# Patient Record
Sex: Female | Born: 1990
Health system: Southern US, Community
[De-identification: ages and names within clinical notes are randomized; demographics above are authoritative.]

## PROBLEM LIST (undated history)

## (undated) DIAGNOSIS — K219 Gastro-esophageal reflux disease without esophagitis: Secondary | ICD-10-CM

## (undated) DIAGNOSIS — F32A Depression, unspecified: Secondary | ICD-10-CM

## (undated) DIAGNOSIS — F329 Major depressive disorder, single episode, unspecified: Secondary | ICD-10-CM

## (undated) DIAGNOSIS — F419 Anxiety disorder, unspecified: Secondary | ICD-10-CM

## (undated) DIAGNOSIS — J45909 Unspecified asthma, uncomplicated: Secondary | ICD-10-CM

## (undated) HISTORY — DX: Unspecified asthma, uncomplicated: J45.909

## (undated) HISTORY — DX: Depression, unspecified: F32.A

## (undated) HISTORY — PX: WISDOM TOOTH EXTRACTION: SHX21

## (undated) HISTORY — PX: ESOPHAGOGASTRODUODENOSCOPY: SHX1529

## (undated) HISTORY — DX: Anxiety disorder, unspecified: F41.9

## (undated) HISTORY — DX: Gastro-esophageal reflux disease without esophagitis: K21.9

---
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
## 2013-04-14 HISTORY — PX: BREAST FIBROADENOMA SURGERY: SHX580

## 2017-04-30 DIAGNOSIS — F422 Mixed obsessional thoughts and acts: Secondary | ICD-10-CM | POA: Diagnosis not present

## 2017-05-04 DIAGNOSIS — Z01419 Encounter for gynecological examination (general) (routine) without abnormal findings: Secondary | ICD-10-CM | POA: Diagnosis not present

## 2017-05-04 DIAGNOSIS — R87612 Low grade squamous intraepithelial lesion on cytologic smear of cervix (LGSIL): Secondary | ICD-10-CM | POA: Diagnosis not present

## 2017-06-25 DIAGNOSIS — F422 Mixed obsessional thoughts and acts: Secondary | ICD-10-CM | POA: Diagnosis not present

## 2017-08-11 ENCOUNTER — Ambulatory Visit (INDEPENDENT_AMBULATORY_CARE_PROVIDER_SITE_OTHER): Payer: BLUE CROSS/BLUE SHIELD | Admitting: Allergy and Immunology

## 2017-08-11 ENCOUNTER — Encounter: Payer: Self-pay | Admitting: Allergy and Immunology

## 2017-08-11 VITALS — BP 132/78 | HR 84 | Temp 98.7°F | Resp 16 | Ht 64.0 in | Wt 116.0 lb

## 2017-08-11 DIAGNOSIS — J3089 Other allergic rhinitis: Secondary | ICD-10-CM | POA: Diagnosis not present

## 2017-08-11 DIAGNOSIS — Z91018 Allergy to other foods: Secondary | ICD-10-CM | POA: Diagnosis not present

## 2017-08-11 NOTE — Progress Notes (Signed)
NEW PATIENT NOTE  Referring Provider: No ref. provider found Primary Provider: Patient, No Pcp Per Date of office visit: 08/11/2017    Subjective:   Chief Complaint:  Abigail Rodgers (DOB: [birth year]) is a 27 y.o. female who presents to the clinic on 08/11/2017 with a chief complaint of Allergy Testing (food sensitivities ) .  HPI: Abigail Rodgers presents to this clinic in evaluation of possible allergies.  She has a history of sneezing and nasal congestion and itchy watery eyes and itchy throat if she gets around cats or dogs or pollen.  For the most part she can get good control of this issue by just performing avoidance measures and she rarely uses any antihistamines or other therapy for her atopic disease.  She also has a significant issue with her abdomen with cramping and bloating.  This apparently occurred on most days until she started sertraline and now this appears to occur about 1 time per month.  She also eliminated consumption of almonds and started Lactaid whenever she use dairy around the same point in time.  She apparently had some type of sensitivity test that showed hypersensitivity against shellfish and dairy and peanut.  She does not eat shellfish but she can eat peanut without any problem.  History reviewed. No pertinent past medical history.  Past Surgical History:  Procedure Laterality Date  . BREAST FIBROADENOMA SURGERY  2015    Allergies as of 08/11/2017      Reactions   Penicillins Rash      Medication List      dicyclomine 10 MG capsule Commonly known as:  BENTYL TK ONE C PO TID PRF ABD CRAMPING   LORazepam 0.5 MG tablet Commonly known as:  ATIVAN TK 1/2 TO 1 T PO BID PRA   sertraline 50 MG tablet Commonly known as:  ZOLOFT TK 1 T PO QAM       Review of systems negative except as noted in HPI / PMHx or noted below:  Review of Systems  Constitutional: Negative.   HENT: Negative.   Eyes: Negative.   Respiratory: Negative.   Cardiovascular:  Negative.   Gastrointestinal: Negative.   Genitourinary: Negative.   Musculoskeletal: Negative.   Skin: Negative.   Neurological: Negative.   Endo/Heme/Allergies: Negative.   Psychiatric/Behavioral: Negative.     Family History  Problem Relation Age of Onset  . Allergic rhinitis Neg Hx   . Asthma Neg Hx   . Eczema Neg Hx   . Urticaria Neg Hx     Social History   Socioeconomic History  . Marital status: Single    Spouse name: Not on file  . Number of children: Not on file  . Years of education: Not on file  . Highest education level: Not on file  Occupational History  . Not on file  Social Needs  . Financial resource strain: Not on file  . Food insecurity:    Worry: Not on file    Inability: Not on file  . Transportation needs:    Medical: Not on file    Non-medical: Not on file  Tobacco Use  . Smoking status: Never Smoker  . Smokeless tobacco: Never Used  Substance and Sexual Activity  . Alcohol use: Yes    Comment: occasional  . Drug use: Never  . Sexual activity: Not on file  Lifestyle  . Physical activity:    Days per week: Not on file    Minutes per session: Not on file  .  Stress: Not on file  Relationships  . Social connections:    Talks on phone: Not on file    Gets together: Not on file    Attends religious service: Not on file    Active member of club or organization: Not on file    Attends meetings of clubs or organizations: Not on file    Relationship status: Not on file  . Intimate partner violence:    Fear of current or ex partner: Not on file    Emotionally abused: Not on file    Physically abused: Not on file    Forced sexual activity: Not on file  Other Topics Concern  . Not on file  Social History Narrative  . Not on file    Environmental and Social history  Lives in a apartment with a dry environment, no animals located inside the household, would on the bedroom floor, no plastic on the bed, plastic on the pillow, and no smokers  located inside the household.  She is a Counsellor.  Objective:   Vitals:   08/11/17 1529  BP: 132/78  Pulse: 84  Resp: 16  Temp: 98.7 F (37.1 C)   Height:  (162.6 cm) Weight: 116 lb (52.6 kg)  Physical Exam  HENT:  Head: Normocephalic. Head is without right periorbital erythema and without left periorbital erythema.  Right Ear: Tympanic membrane, external ear and ear canal normal.  Left Ear: Tympanic membrane, external ear and ear canal normal.  Nose: Nose normal. No mucosal edema or rhinorrhea.  Mouth/Throat: Uvula is midline, oropharynx is clear and moist and mucous membranes are normal. No oropharyngeal exudate.  Eyes: Pupils are equal, round, and reactive to light. Conjunctivae and lids are normal.  Neck: Trachea normal. No tracheal tenderness present. No tracheal deviation present. No thyromegaly present.  Cardiovascular: Normal rate, regular rhythm, S1 normal, S2 normal and normal heart sounds.  No murmur heard. Pulmonary/Chest: Effort normal and breath sounds normal. No stridor. No respiratory distress. She has no wheezes. She has no rales. She exhibits no tenderness.  Abdominal: Soft. She exhibits no distension and no mass. There is no hepatosplenomegaly. There is no tenderness. There is no rebound and no guarding.  Musculoskeletal: She exhibits no edema or tenderness.  Lymphadenopathy:       Head (right side): No tonsillar adenopathy present.       Head (left side): No tonsillar adenopathy present.    She has no cervical adenopathy.    She has no axillary adenopathy.  Neurological: She is alert.  Skin: No rash noted. She is not diaphoretic. No erythema. No pallor. Nails show no clubbing.    Diagnostics: Allergy skin tests were performed.  She did not demonstrate any hypersensitivity against a screening panel of foods including shellfish.  She did demonstrate significant hypersensitivity against grasses, weeds, trees, molds, cat, and dog.  Assessment and  Plan:    1. Other allergic rhinitis   2. Food allergy     1.  Allergen avoidance measures  2.  Can use OTC loratadine 10 mg 1 time per day if needed  3.  Further evaluation and treatment?  4.  Review results of previous sensitivity testing  There is no evidence that medicine has a significant food allergy.  I will review her previous sensitivity blood testing to see exactly what test was ordered.  I suspect that this was an IgG food panel which is not associated with food allergy.  She does appear to have sensitivity  directed against various aeroallergens and we have given her literature on how to avoid these exposures and she can use an antihistamine as needed.  I will see her back in this clinic if needed.  Laurette Schimke, MD Allergy / Immunology [facility]

## 2017-08-11 NOTE — Patient Instructions (Addendum)
  1.  Allergen avoidance measures  2.  Can use OTC loratadine 10 mg 1 time per day if needed  3.  Further evaluation and treatment?  4.  Review results of previous sensitivity testing

## 2017-08-12 ENCOUNTER — Encounter: Payer: Self-pay | Admitting: Allergy and Immunology

## 2017-10-09 DIAGNOSIS — F422 Mixed obsessional thoughts and acts: Secondary | ICD-10-CM | POA: Diagnosis not present

## 2017-12-26 DIAGNOSIS — J029 Acute pharyngitis, unspecified: Secondary | ICD-10-CM | POA: Diagnosis not present

## 2017-12-26 DIAGNOSIS — R030 Elevated blood-pressure reading, without diagnosis of hypertension: Secondary | ICD-10-CM | POA: Diagnosis not present

## 2017-12-28 DIAGNOSIS — J029 Acute pharyngitis, unspecified: Secondary | ICD-10-CM | POA: Diagnosis not present

## 2018-03-14 ENCOUNTER — Encounter: Payer: Self-pay | Admitting: Emergency Medicine

## 2018-03-14 DIAGNOSIS — F429 Obsessive-compulsive disorder, unspecified: Secondary | ICD-10-CM | POA: Insufficient documentation

## 2018-03-26 ENCOUNTER — Ambulatory Visit (INDEPENDENT_AMBULATORY_CARE_PROVIDER_SITE_OTHER): Payer: BLUE CROSS/BLUE SHIELD | Admitting: Physician Assistant

## 2018-03-26 ENCOUNTER — Encounter: Payer: Self-pay | Admitting: Physician Assistant

## 2018-03-26 DIAGNOSIS — F422 Mixed obsessional thoughts and acts: Secondary | ICD-10-CM | POA: Diagnosis not present

## 2018-03-26 MED ORDER — SERTRALINE HCL 50 MG PO TABS: 50.0000 mg | ORAL_TABLET | Freq: Every day | ORAL | 1 refills | Status: DC

## 2018-03-26 NOTE — Progress Notes (Signed)
[facility]  Patient ID: Abigail Rodgers,  MRN: [id number]  PCP: Waldemar DickensWard, Demming M, MD  Date of Evaluation: 03/26/2018 Time spent:10 minutes  Chief Complaint:  Chief Complaint    Follow-up      HISTORY/CURRENT STATUS: HPI here for 6993-month med check.  Patient states she is doing great.  "This is possibly the best year of ever had!  ".  The obsessive thoughts and behaviors are much better.  Dates that every once in a while she will have thoughts that she cannot get out of her mind but not often.  She is only had to take the Ativan maybe twice since her last visit.  Patient denies loss of interest in usual activities and is able to enjoy things.  Denies decreased energy or motivation.  Appetite has not changed.  No extreme sadness, tearfulness, or feelings of hopelessness.  Denies any changes in concentration, making decisions or remembering things.  Denies suicidal or homicidal thoughts.  She sleeps well.  Work is going [employer].  She is very busy but likes what she is doing at [employer].  She bought a house recently and has been working fixing that up.  Individual Medical History/ Review of Systems: Changes? :No    Past medications for mental health diagnoses include: None  Allergies: Penicillins  Current Medications:  Current Outpatient Medications:  .  LORazepam (ATIVAN) 0.5 MG tablet, TK 1/2 TO 1 T PO BID PRA, Disp: , Rfl:  .  sertraline (ZOLOFT) 50 MG tablet, Take 1 tablet (50 mg total) by mouth daily., Disp: 90 tablet, Rfl: 1 Medication Side Effects: none  Family Medical/ Social History: Changes? No  MENTAL HEALTH EXAM:  There were no vitals taken for this visit.There is no height or weight on file to calculate BMI.  General Appearance: Casual and Well Groomed  Eye Contact:  Good  Speech:  Clear and Coherent  Volume:  Normal  Mood:  Euthymic  Affect:  Appropriate  Thought Process:  Goal Directed  Orientation:  Full (Time, Place, and Person)  Thought Content:  Logical   Suicidal Thoughts:  No  Homicidal Thoughts:  No  Memory:  WNL  Judgement:  Good  Insight:  Good  Psychomotor Activity:  Normal  Concentration:  Concentration: Good  Recall:  Good  Fund of Knowledge: Good  Language: Good  Assets:  Desire for Improvement  ADL's:  Intact  Cognition: WNL  Prognosis:  Good    DIAGNOSES:    ICD-10-CM   1. Mixed obsessional thoughts and acts F42.2     Receiving Psychotherapy: No    RECOMMENDATIONS: Continue Zoloft and Ativan as above. Return in 6 months or sooner as needed.   Melony Overlyeresa Hurst, PA-C

## 2018-09-23 ENCOUNTER — Other Ambulatory Visit: Payer: Self-pay | Admitting: Physician Assistant

## 2018-09-23 NOTE — Telephone Encounter (Signed)
Has appt tomorrow 06/12

## 2018-09-24 ENCOUNTER — Encounter: Payer: Self-pay | Admitting: Physician Assistant

## 2018-09-24 ENCOUNTER — Other Ambulatory Visit: Payer: Self-pay

## 2018-09-24 ENCOUNTER — Ambulatory Visit (INDEPENDENT_AMBULATORY_CARE_PROVIDER_SITE_OTHER): Payer: BC Managed Care – PPO | Admitting: Physician Assistant

## 2018-09-24 DIAGNOSIS — F422 Mixed obsessional thoughts and acts: Secondary | ICD-10-CM | POA: Diagnosis not present

## 2018-09-24 MED ORDER — SERTRALINE HCL 25 MG PO TABS: ORAL_TABLET | ORAL | 0 refills | Status: DC

## 2018-09-24 NOTE — Progress Notes (Signed)
[facility]  Patient ID: Abigail Rodgers,  MRN: [id number]  PCP: Toya Smothers, MD  Date of Evaluation: 09/24/2018 Time spent:15 minutes  Chief Complaint:  Chief Complaint    Follow-up      HISTORY/CURRENT STATUS: HPI for 98-month med check.  Patient states she is doing very well and would like to go off of the Zoloft if possible.  She is not having any more anxiety to speak of.  She has needed 2 of the Ativan in the past 5 months.  She is not having obsessive thoughts or compulsive behaviors.  Work is going very well and she is working from home now due to the coronavirus pandemic.  She sleeps well.  Patient denies loss of interest in usual activities and is able to enjoy things.  Denies decreased energy or motivation.  Appetite has not changed.  No extreme sadness, tearfulness, or feelings of hopelessness.  Denies any changes in concentration, making decisions or remembering things.  Denies suicidal or homicidal thoughts.  Denies dizziness, syncope, seizures, numbness, tingling, tremor, tics, unsteady gait, slurred speech, confusion. Denies muscle or joint pain, stiffness, or dystonia.  Individual Medical History/ Review of Systems: Changes? :No    Past medications for mental health diagnoses include: Zoloft, Ativan  Allergies: Penicillins  Current Medications:  Current Outpatient Medications:  .  LORazepam (ATIVAN) 0.5 MG tablet, TK 1/2 TO 1 T PO BID PRA, Disp: , Rfl:  .  sertraline (ZOLOFT) 25 MG tablet, 1.5 po qd for 7 days, then 1 qd for 7 days, then 1/2 pill till gone., Disp: 21 tablet, Rfl: 0 Medication Side Effects: none  Family Medical/ Social History: Changes? No except working from home now  [place]:  There were no vitals taken for this visit.There is no height or weight on file to calculate BMI.  General Appearance: Casual  Eye Contact:  Good  Speech:  Clear and Coherent  Volume:  Normal  Mood:  Euthymic  Affect:  Appropriate  Thought  Process:  Goal Directed  Orientation:  Full (Time, Place, and Person)  Thought Content: Logical   Suicidal Thoughts:  No  Homicidal Thoughts:  No  Memory:  WNL  Judgement:  Good  Insight:  Good  Psychomotor Activity:  Normal  Concentration:  Concentration: Good  Recall:  Good  Fund of Knowledge: Good  Language: Good  Assets:  Desire for Improvement  ADL's:  Intact  Cognition: WNL  Prognosis:  Good    DIAGNOSES:    ICD-10-CM   1. Mixed obsessional thoughts and acts  F42.2     Receiving Psychotherapy: No    RECOMMENDATIONS:  It is okay to wean off the Zoloft.  If the obsessive thoughts recur or anxiety worsens then we will need to restart it.  We can do that by phone if needed. Wean off Zoloft as follows 25 mg, 1.5 pills daily for 1 week, then 1 p.o. daily for 1 week, then 1/2 pill p.o. daily for 1 week then stop. Continue Ativan 0.5 mg 1/2-1 twice daily as needed. Return in 6 months.   Donnal Moat, PA-C   This record has been created using [employer].  Chart creation errors have been sought, but may not always have been located and corrected. Such creation errors do not reflect on the standard of medical care.

## 2018-09-30 DIAGNOSIS — Z01419 Encounter for gynecological examination (general) (routine) without abnormal findings: Secondary | ICD-10-CM | POA: Diagnosis not present

## 2018-09-30 DIAGNOSIS — Z118 Encounter for screening for other infectious and parasitic diseases: Secondary | ICD-10-CM | POA: Diagnosis not present

## 2018-09-30 DIAGNOSIS — Z6820 Body mass index (BMI) 20.0-20.9, adult: Secondary | ICD-10-CM | POA: Diagnosis not present

## 2018-09-30 DIAGNOSIS — Z113 Encounter for screening for infections with a predominantly sexual mode of transmission: Secondary | ICD-10-CM | POA: Diagnosis not present

## 2018-09-30 DIAGNOSIS — B373 Candidiasis of vulva and vagina: Secondary | ICD-10-CM | POA: Diagnosis not present

## 2018-09-30 DIAGNOSIS — Z1159 Encounter for screening for other viral diseases: Secondary | ICD-10-CM | POA: Diagnosis not present

## 2018-09-30 DIAGNOSIS — Z114 Encounter for screening for human immunodeficiency virus [HIV]: Secondary | ICD-10-CM | POA: Diagnosis not present

## 2018-10-04 ENCOUNTER — Other Ambulatory Visit: Payer: Self-pay | Admitting: Obstetrics and Gynecology

## 2018-10-04 DIAGNOSIS — N6310 Unspecified lump in the right breast, unspecified quadrant: Secondary | ICD-10-CM

## 2018-10-12 ENCOUNTER — Other Ambulatory Visit: Payer: Self-pay | Admitting: Obstetrics and Gynecology

## 2018-10-12 ENCOUNTER — Ambulatory Visit
Admission: RE | Admit: 2018-10-12 | Discharge: 2018-10-12 | Disposition: A | Discharge status: Home / Self Care | Payer: BC Managed Care – PPO | Source: Ambulatory Visit | Attending: Obstetrics and Gynecology | Admitting: Obstetrics and Gynecology

## 2018-10-12 ENCOUNTER — Other Ambulatory Visit: Payer: Self-pay

## 2018-10-12 DIAGNOSIS — N6310 Unspecified lump in the right breast, unspecified quadrant: Secondary | ICD-10-CM

## 2018-10-12 DIAGNOSIS — N6313 Unspecified lump in the right breast, lower outer quadrant: Secondary | ICD-10-CM | POA: Diagnosis not present

## 2018-10-12 DIAGNOSIS — N6314 Unspecified lump in the right breast, lower inner quadrant: Secondary | ICD-10-CM | POA: Diagnosis not present

## 2019-02-04 ENCOUNTER — Encounter: Payer: Self-pay | Admitting: Gastroenterology

## 2019-02-16 ENCOUNTER — Ambulatory Visit: Payer: BC Managed Care – PPO | Admitting: Gastroenterology

## 2019-02-16 ENCOUNTER — Other Ambulatory Visit: Payer: Self-pay

## 2019-02-16 ENCOUNTER — Encounter: Payer: Self-pay | Admitting: Gastroenterology

## 2019-02-16 VITALS — BP 130/90 | HR 84 | Temp 98.9°F | Ht 65.0 in | Wt 122.1 lb

## 2019-02-16 DIAGNOSIS — K625 Hemorrhage of anus and rectum: Secondary | ICD-10-CM | POA: Diagnosis not present

## 2019-02-16 DIAGNOSIS — K648 Other hemorrhoids: Secondary | ICD-10-CM

## 2019-02-16 MED ORDER — HYDROCORTISONE ACETATE 25 MG RE SUPP: 25.0000 mg | Freq: Every day | RECTAL | 2 refills | Status: DC

## 2019-02-16 NOTE — Patient Instructions (Signed)
If you are age 30 or older, your body mass index should be between 23-30. Your Body mass index is 20.32 kg/m. If this is out of the aforementioned range listed, please consider follow up with your Primary Care Provider.  If you are age 40 or younger, your body mass index should be between 19-25. Your Body mass index is 20.32 kg/m. If this is out of the aformentioned range listed, please consider follow up with your Primary Care Provider.   We have sent the following medications to your pharmacy for you to pick up at your convenience: Hydrocortisone suppositories  Follow up as needed.  Thank you for choosing me and [facility].   Alonza Bogus, PA-C

## 2019-02-16 NOTE — Progress Notes (Signed)
02/16/2019 Abigail Rodgers [id number] [birth year]   HISTORY OF PRESENT ILLNESS:  This is a pleasant 28 year old female who is new to our office.  She is here today with complaints of rectal bleeding.  She says that back in 2016 after doing a lot of sit-ups she developed a painful hemorrhoid.  She used wipes, etc and it got better, but since that time she has periodically seen small amounts of bright red blood on the toilet paper upon wiping.  She tells me that recently she's had two episodes of more bleeding, still bright red in color, but drips into the toilet bowl even between bowel movements when she sits to urinate, etc.  This bleeding lasts usually 3 days before resolving and then she has the very light bleeding on the toilet paper between episodes.  No rectal pain, no abdominal pain.  Moves her bowels well without any issues, no constipation or diarrhea.  No family history of colon cancer.  Mom and dad had polyps.   Past Medical History:  Diagnosis Date   Anxiety    Asthma    Depression    GERD (gastroesophageal reflux disease)    Past Surgical History:  Procedure Laterality Date   BREAST FIBROADENOMA SURGERY  2015   WISDOM TOOTH EXTRACTION      reports that she has never smoked. She has never used smokeless tobacco. She reports current alcohol use of about 1.0 standard drinks of alcohol per week. She reports that she does not use drugs. family history includes Colon polyps in her father and mother; Dementia in her maternal grandmother; Hyperlipidemia in her brother, maternal aunt, maternal grandmother, and maternal uncle; Hypertension in her mother; Irritable bowel syndrome in her maternal grandmother; Prostate cancer in her paternal grandfather. Allergies  Allergen Reactions   Penicillins Rash      Outpatient Encounter Medications as of 02/16/2019  Medication Sig   hydrocortisone (ANUSOL-HC) 25 MG suppository Place 1 suppository (25 mg total) rectally at bedtime. Use  for 10 days.   [DISCONTINUED] LORazepam (ATIVAN) 0.5 MG tablet TK 1/2 TO 1 T PO BID PRA   [DISCONTINUED] sertraline (ZOLOFT) 25 MG tablet 1.5 po qd for 7 days, then 1 qd for 7 days, then 1/2 pill till gone.   No facility-administered encounter medications on file as of 02/16/2019.      REVIEW OF SYSTEMS  : All other systems reviewed and negative except where noted in the History of Present Illness.   PHYSICAL EXAM: BP 130/90 (BP Location: Left Arm, Patient Position: Sitting, Cuff Size: Normal)    Pulse 84    Temp 98.9 F (37.2 C)    Ht 5\' 5"  (1.651 m) Comment: height measured without shoes   Wt 122 lb 2 oz (55.4 kg)    LMP 01/26/2019    BMI 20.32 kg/m  General: Well developed white female in no acute distress Head: Normocephalic and atraumatic Eyes:  Sclerae anicteric, conjunctiva pink. Ears: Normal auditory acuity Lungs: Clear throughout to auscultation; no increased WOB. Heart: Regular rate and rhythm; no M/R/G. Abdomen: Soft, non-distended.  BS present.  Non-tender. Rectal:  Small external hemorrhoid tissue noted.  DRE did not reveal any masses.  Small amount of light brown stool on exam glove.  Anoscopy revealed bleeding internal hemorrhoid posteriorly, scope induced bleeding. Musculoskeletal: Symmetrical with no gross deformities  Skin: No lesions on visible extremities Extremities: No edema  Neurological: Alert oriented x 4, grossly non-focal Psychological:  Alert and cooperative. Normal mood and  affect  ASSESSMENT AND PLAN: *Bleeding internal hemorrhoid:  Bleeding seen on anoscopy exam.  Will treat with hydrocortisone suppositories at bedtime for 7-10 days and repeat if needed.  Keep stools soft and avoid aggressive cleaning.  Discussed role of in-office hemorrhoid banding if bleeding continues to be an issue and she ultimately wants to proceed with that.  Literature given.     CC:  Ward, Ellison Hughs, MD

## 2019-02-17 NOTE — Progress Notes (Signed)
____________________________________________________________  Attending physician addendum:  Thank you for sending this case to me. I have reviewed the entire note, and the outlined plan seems appropriate.  Henry Danis, MD  ____________________________________________________________  

## 2019-03-08 DIAGNOSIS — A630 Anogenital (venereal) warts: Secondary | ICD-10-CM | POA: Diagnosis not present

## 2019-03-25 ENCOUNTER — Ambulatory Visit (INDEPENDENT_AMBULATORY_CARE_PROVIDER_SITE_OTHER): Payer: BC Managed Care – PPO | Admitting: Physician Assistant

## 2019-03-25 ENCOUNTER — Encounter: Payer: Self-pay | Admitting: Physician Assistant

## 2019-03-25 ENCOUNTER — Other Ambulatory Visit: Payer: Self-pay

## 2019-03-25 DIAGNOSIS — F422 Mixed obsessional thoughts and acts: Secondary | ICD-10-CM

## 2019-03-25 NOTE — Progress Notes (Signed)
[facility]  Patient ID: Abigail Rodgers,  MRN: [id number]  PCP: Toya Smothers, MD  Date of Evaluation: 03/25/2019 Time spent:10 minutes  Chief Complaint:  Chief Complaint    Follow-up      HISTORY/CURRENT STATUS: HPI For 6 month med check.  She weaned off the Zoloft during the summer, with my direction.  States she has been doing great since then.  She will occasionally have a time where she is more anxious and has obsessive thoughts but it is not common.  Since being off of the Zoloft, she is also felt a little bit more moody but says she has been that way all of her life and it is not something that she wants to take medicine for right now.  Patient denies loss of interest in usual activities and is able to enjoy things.  Denies decreased energy or motivation.  Appetite has not changed.  No extreme sadness, tearfulness, or feelings of hopelessness.  Denies any changes in concentration, making decisions or remembering things.  Denies suicidal or homicidal thoughts.  Denies dizziness, syncope, seizures, numbness, tingling, tremor, tics, unsteady gait, slurred speech, confusion. Denies muscle or joint pain, stiffness, or dystonia.  Individual Medical History/ Review of Systems: Changes? :No   Past medications for mental health diagnoses include: Zoloft, Ativan  Allergies: Penicillins  Current Medications:  Current Outpatient Medications:  .  hydrocortisone (ANUSOL-HC) 25 MG suppository, Place 1 suppository (25 mg total) rectally at bedtime. Use for 10 days. (Patient not taking: Reported on 03/25/2019), Disp: 12 suppository, Rfl: 2 Medication Side Effects: none  Family Medical/ Social History: Changes? No  MENTAL HEALTH EXAM:  There were no vitals taken for this visit.There is no height or weight on file to calculate BMI.  General Appearance: Casual, Neat and Well Groomed  Eye Contact:  Good  Speech:  Clear and Coherent  Volume:  Normal  Mood:  Euthymic   Affect:  Appropriate  Thought Process:  Goal Directed  Orientation:  Full (Time, Place, and Person)  Thought Content: Logical   Suicidal Thoughts:  No  Homicidal Thoughts:  No  Memory:  WNL  Judgement:  Good  Insight:  Good  Psychomotor Activity:  Normal  Concentration:  Concentration: Good  Recall:  Good  Fund of Knowledge: Good  Language: Good  Assets:  Desire for Improvement  ADL's:  Intact  Cognition: WNL  Prognosis:  Good    DIAGNOSES:    ICD-10-CM   1. Mixed obsessional thoughts and acts  F42.2     Receiving Psychotherapy: No    RECOMMENDATIONS:  I am glad to see her doing so well!  She will stay off Zoloft and will return as needed.  Donnal Moat, PA-C

## 2019-04-14 ENCOUNTER — Ambulatory Visit
Admission: RE | Admit: 2019-04-14 | Discharge: 2019-04-14 | Disposition: A | Discharge status: Home / Self Care | Payer: BC Managed Care – PPO | Source: Ambulatory Visit | Attending: Obstetrics and Gynecology | Admitting: Obstetrics and Gynecology

## 2019-04-14 ENCOUNTER — Other Ambulatory Visit: Payer: Self-pay

## 2019-04-14 ENCOUNTER — Other Ambulatory Visit: Payer: Self-pay | Admitting: Obstetrics and Gynecology

## 2019-04-14 DIAGNOSIS — N6310 Unspecified lump in the right breast, unspecified quadrant: Secondary | ICD-10-CM

## 2019-04-14 DIAGNOSIS — N6313 Unspecified lump in the right breast, lower outer quadrant: Secondary | ICD-10-CM | POA: Diagnosis not present

## 2019-04-14 DIAGNOSIS — N6001 Solitary cyst of right breast: Secondary | ICD-10-CM | POA: Diagnosis not present

## 2019-04-18 DIAGNOSIS — A630 Anogenital (venereal) warts: Secondary | ICD-10-CM | POA: Diagnosis not present

## 2019-10-06 DIAGNOSIS — Z0000 Encounter for general adult medical examination without abnormal findings: Secondary | ICD-10-CM | POA: Diagnosis not present

## 2019-10-12 DIAGNOSIS — Z01419 Encounter for gynecological examination (general) (routine) without abnormal findings: Secondary | ICD-10-CM | POA: Diagnosis not present

## 2019-10-12 DIAGNOSIS — Z681 Body mass index (BMI) 19 or less, adult: Secondary | ICD-10-CM | POA: Diagnosis not present

## 2019-10-12 DIAGNOSIS — N898 Other specified noninflammatory disorders of vagina: Secondary | ICD-10-CM | POA: Diagnosis not present

## 2019-10-13 ENCOUNTER — Ambulatory Visit: Payer: BC Managed Care – PPO | Attending: Family Medicine | Admitting: Physical Therapy

## 2019-10-13 ENCOUNTER — Other Ambulatory Visit: Payer: Self-pay

## 2019-10-13 ENCOUNTER — Encounter: Payer: Self-pay | Admitting: Physical Therapy

## 2019-10-13 DIAGNOSIS — R252 Cramp and spasm: Secondary | ICD-10-CM | POA: Diagnosis not present

## 2019-10-13 DIAGNOSIS — M6281 Muscle weakness (generalized): Secondary | ICD-10-CM | POA: Insufficient documentation

## 2019-10-13 DIAGNOSIS — R279 Unspecified lack of coordination: Secondary | ICD-10-CM | POA: Diagnosis not present

## 2019-10-13 NOTE — Patient Instructions (Addendum)
STRETCHING THE PELVIC FLOOR MUSCLES NO DILATOR  Supplies . Vaginal lubricant . Mirror (optional) . Gloves (optional) or clean hands Positioning . Start in a semi-reclined position with your head propped up. Bend your knees and place your thumb or finger at the vaginal opening. Procedure . Apply a moderate amount of lubricant on the outer skin of your vagina, the labia minora.  Apply additional lubricant to your finger. Marland Kitchen Spread the skin away from the vaginal opening. Place the end of your finger at the opening. . Do a maximum contraction of the pelvic floor muscles. Tighten the vagina and the anus maximally and relax. . When you know they are relaxed, gently and slowly insert your finger into your vagina, directing your finger slightly downward, for 2-3 inches of insertion. . Relax and stretch the 6 o'clock position . Hold each stretch for _30-60 seconds, no pain more than 3/10 . Repeat the stretching in the 4 o'clock and 8 o'clock positions. . Next gently move your finger in a "U" shape  several times.  . You can also enter a second finger to work to spread the vaginal opening wider from 3:00-6:00 and 6:00-9:00 or 3:00-9:00 . Perform daily or every other day . Once you have accomplished the techniques you may try them in standing with one foot resting on the tub, or in other positions.  This is a good stretch to do in the shower if you don't need to use lubricant.  This can be done at 35 weeks or later in your pregnancy.  PROTOCOL FOR VAGINAL DILATORS   1. Wash dilator with soap and water prior to insertion.    2. Lay on your back reclined. Knees are to be up and apart while on your bed or in the bathtub with warm water.   3. Lubricate the end of the dilator with a water-soluble lubricant.  4. Separate the labia.   5. Tense the pelvic floor muscles than relax; while relaxing, slide lubricated dilator( round side of dilator) into the vagina.  Insert toward the direction of your spine.  Dilator should feel snug and no pain more than 3/10.   6. Tense muscles again while holding the dilator so it does not get pushed out; relax and slide it in a little further.   7. Try blowing out as if filling a balloon; this may relax the muscles and allow penetration.  Repeat blowing out to insert dilator further.  8. Keep dilator in for 10 minutes if tolerate, with the pelvic floor muscles relaxed to further stretch the canal.   9. Never force the dilator into the canal. 10. Once the dilator is comfortable start to move in and out, side to side, move your hips in different directions  11. 3-4 times per week 12. Progression of dilator.  a. When you are able to place dilator into vaginal canal and feel no pain or able to move without difficulty you are ready for the next size.  b. Before you go to the next size start with the original size for 2 minutes then use the next size up for 5 minutes. c. When the next size up is easy to use, do not have to start with the smaller size.  Access Code: [email or web address] URL: [email or web address] Date: 10/13/2019 Prepared by: Abigail Rodgers  Exercises Supine Diaphragmatic Breathing - 3 x daily - 7 x weekly - 10 reps - 1 sets  Patient Education Office Posture

## 2019-10-13 NOTE — Therapy (Signed)
[facility] [address] [place], [place], [address] Phone: [phone]   Fax:  [phone]  Physical Therapy Treatment  Patient Details  Name: Abigail Rodgers MRN: [id number] Date of Birth: 1990-10-01 Referring Provider (PT): Shon Hale, MD   Encounter Date: 10/13/2019   PT End of Session - 10/13/19 0758    Visit Number 1    Date for PT Re-Evaluation 01/05/20    PT Start Time 0758    PT Stop Time 0841    PT Time Calculation (min) 43 min    Activity Tolerance Patient tolerated treatment well    Behavior During Therapy [facility] for tasks assessed/performed           Past Medical History:  Diagnosis Date  . Anxiety   . Asthma   . Depression   . GERD (gastroesophageal reflux disease)     Past Surgical History:  Procedure Laterality Date  . BREAST FIBROADENOMA SURGERY  2015  . WISDOM TOOTH EXTRACTION      There were no vitals filed for this visit.   Subjective Assessment - 10/13/19 0802    Subjective Pt states she has been having pain during intercourse. Pain is deep upper right abdominal region and it is severe has to stop.  I can feel that there is anxiety and I am bracing because I know it's going to hurt.    Patient Stated Goals be able to have intimicacy with partner pain free    Currently in Pain? No/denies              [facility] PT Assessment - 10/13/19 0001      Assessment   Medical Diagnosis N94.2 (ICD-10-CM) - Vaginismus    Referring Provider (PT) Shon Hale, MD      Precautions   Precautions None      Balance Screen   Has the patient fallen in the past 6 months No      Home Environment   Living Environment Private residence    Living Arrangements Alone      Prior Function   Level of Independence Independent    Vocation Full time employment    Vocation Requirements desk work    Leisure yoga; sometimes run and gym; yard work      Cognition   Overall Cognitive Status  Within [employer] for tasks assessed      Posture/Postural Control   Posture/Postural Control No significant limitations      ROM / Strength   AROM / PROM / Strength Strength;PROM      PROM   Overall PROM Comments Rt hip flexion and ER 10% limited; Lt ER 10% limited      Strength   Overall Strength Comments Rt hip abduction 4+/5      Flexibility   Soft Tissue Assessment /Muscle Length yes    Hamstrings Lt 20% limted; Rt 10% limted      Palpation   Palpation comment lumbar and thoracic paraspinals Rt side tighter than left; ribcage movement and spinal mobilty WNL      Special Tests   Other special tests ASLR maybe a little easier with compression                      Pelvic Floor Special Questions - 10/13/19 0001    Are you Pregnant or attempting pregnancy? No    Prior Pregnancies No    Currently Sexually Active Yes    Is this Painful  Yes    Marinoff Scale pain interrupts completion    Urinary Leakage No    Urinary urgency No    Fecal incontinence No    Fluid intake 6-8    Skin Integrity Intact    External Palpation normal    Prolapse None    Pelvic Floor Internal Exam Pt identity confirmed and informed consent given to perform internal assessment    Exam Type Vaginal    Palpation tight throughout normal sensation; not TTP; unable to relax/bulge after one contraction    Strength weak squeeze, no lift    Strength # of reps --   3 in 10 sec quick flick   Strength # of seconds 15    Tone high             [facility] Adult PT Treatment/Exercise - 10/13/19 0001      Self-Care   Self-Care Other Self-Care Comments    Other Self-Care Comments  stretching; intial HEP; breathing and relaxing                  PT Education - 10/13/19 0852    Education Details Access Code: [email or web address]; stretch pelvic floor    Person(s) Educated Patient    Methods Explanation;Demonstration;Verbal cues;Handout    Comprehension Verbalized understanding;Returned  demonstration            PT Short Term Goals - 10/13/19 1131      PT SHORT TERM GOAL #1   Title ind with initial HEP    Time 4    Period Weeks    Status New    Target Date 11/10/19             PT Long Term Goals - 10/13/19 1030      PT LONG TERM GOAL #1   Title Pt will report she is able to orgasm when she is intimate with her partner due to improved ability to relax and improved pelvic floor strength    Time 12    Period Weeks    Status New    Target Date 01/05/20      PT LONG TERM GOAL #2   Title Pt will report 1/3 or less on Marinoff scale    Time 12    Period Weeks    Status New    Target Date 01/05/20      PT LONG TERM GOAL #3   Title Pt will be able to perform quick flicks at least 8x in 10 seconds in order to demonstrate improved ability to coordinate and contract/relax    Time 12    Period Weeks    Status New    Target Date 01/05/20      PT LONG TERM GOAL #4   Title Pt will be ind with advanced HEP and dilator usage in order to improve pelvic floor strength and function    Time 12    Period Weeks    Status New    Target Date 01/05/20                 Plan - 10/13/19 0932    Clinical Impression Statement Pt presents to skilled PT due to dyspareunia that she has had for many years.  Pt states the pain is in Rt upper quadrant.  pt has high tone throughout pelvic floor.  No severe tenderness but muscles tighten and guarding with any palpation.  Pt has some core weakness noted with ASLR. Pt has decreased ROM Rt hip flexion and  bilateral ER.  Hamstring flexibility is limited bilateral and Rt hip abduction strength weakness.  Pt has good ribcage and spine moiblty  Tight lumbar and thoracic paraspinals Lt>Rt.  Pt has decreased pevic floor strength due to high tone and difficulty relaxing.  Pt will benefit from skilled PT to improve muscle coordination and address impairments for maximum function and imporved quality of life    Personal Factors and  Comorbidities Time since onset of injury/illness/exacerbation    Examination-Participation Restrictions Interpersonal Relationship    Stability/Clinical Decision Making Stable/Uncomplicated    Clinical Decision Making Low    Rehab Potential Excellent    PT Frequency 1x / week    PT Duration 12 weeks    PT Treatment/Interventions ADLs/Self Care Home Management;Biofeedback;Cryotherapy;Electrical Stimulation;Moist Heat;Neuromuscular re-education;Therapeutic exercise;Therapeutic activities;Patient/family education;Dry needling;Manual techniques;Taping    PT Next Visit Plan f/u on dilators and positions breathe and bulgel with stretches    PT Home Exercise Plan Access Code: [email or web address] and stretch pelvic floor    Consulted and Agree with Plan of Care Patient           Patient will benefit from skilled therapeutic intervention in order to improve the following deficits and impairments:  Decreased coordination, Impaired tone, Increased fascial restricitons, Decreased range of motion, Pain, Impaired flexibility, Decreased strength  Visit Diagnosis: Cramp and spasm - Plan: PT plan of care cert/re-cert  Muscle weakness (generalized) - Plan: PT plan of care cert/re-cert  Unspecified lack of coordination - Plan: PT plan of care cert/re-cert     Problem List Patient Active Problem List   Diagnosis Date Noted  . OCD (obsessive compulsive disorder) 03/14/2018    Junious Silk, PT 10/13/2019, 11:48 AM  [facility] [address] [place], [place], [address] Phone: [phone]   Fax:  [phone]  Name: Lillianna Sabel MRN: [id number] Date of Birth: [birth year]

## 2019-10-14 ENCOUNTER — Encounter: Payer: Self-pay | Admitting: Physician Assistant

## 2019-10-14 ENCOUNTER — Other Ambulatory Visit: Payer: Self-pay

## 2019-10-14 ENCOUNTER — Ambulatory Visit (INDEPENDENT_AMBULATORY_CARE_PROVIDER_SITE_OTHER): Payer: BC Managed Care – PPO | Admitting: Physician Assistant

## 2019-10-14 DIAGNOSIS — F422 Mixed obsessional thoughts and acts: Secondary | ICD-10-CM | POA: Diagnosis not present

## 2019-10-14 MED ORDER — SERTRALINE HCL 25 MG PO TABS: 25.0000 mg | ORAL_TABLET | Freq: Every day | ORAL | 1 refills | Status: DC

## 2019-10-14 MED ORDER — LORAZEPAM 0.5 MG PO TABS: 0.2500 mg | ORAL_TABLET | Freq: Three times a day (TID) | ORAL | 1 refills | Status: DC | PRN

## 2019-10-14 NOTE — Progress Notes (Signed)
[facility]  Patient ID: Abigail Rodgers,  MRN: [id number]  PCP: Waldemar Dickens, MD  Date of Evaluation: 10/14/2019 Time spent:20 minutes  Chief Complaint:  Chief Complaint    Anxiety      HISTORY/CURRENT STATUS: HPI Not doing well.  Pt was seen about 6 months ago, and she had already stopped the Zoloft. She's more anxious. "I'm naturally an anxious person anyway." Went to [place] with her boyfriend and had a horrible time.  Was very anxious and had to fly home because she could not take it anymore.  She ended up paying $2000 because the only thing that was available was first-class.  There were several days when she was there that she felt like the whole day was 1 big panic attack.  Mostly has generalized anxiety, she can let thoughts go.  She obsesses over things over and over.  Not really having depression.  She does get down because she feels so anxious.  Energy and motivation are good.  She is able to work.  No suicidal or homicidal thoughts.  Patient denies increased energy with decreased need for sleep, no increased talkativeness, no racing thoughts, no impulsivity or risky behaviors, no increased spending, no increased libido, no grandiosity, no increased irritability or anger, and no hallucinations.  Denies dizziness, syncope, seizures, numbness, tingling, tremor, tics, unsteady gait, slurred speech, confusion. Denies muscle or joint pain, stiffness, or dystonia.  Individual Medical History/ Review of Systems: Changes? :No   Past medications for mental health diagnoses include: Zoloft, Ativan  Allergies: Penicillins  Current Medications:  Current Outpatient Medications:  .  hydrocortisone (ANUSOL-HC) 25 MG suppository, Place 1 suppository (25 mg total) rectally at bedtime. Use for 10 days. (Patient not taking: Reported on 03/25/2019), Disp: 12 suppository, Rfl: 2 .  LORazepam (ATIVAN) 0.5 MG tablet, Take 0.5-1 tablets (0.25-0.5 mg total) by mouth every 8 (eight)  hours as needed for anxiety., Disp: 30 tablet, Rfl: 1 .  sertraline (ZOLOFT) 25 MG tablet, Take 1 tablet (25 mg total) by mouth daily., Disp: 30 tablet, Rfl: 1 Medication Side Effects: none  Family Medical/ Social History: Changes? No  MENTAL HEALTH EXAM:  There were no vitals taken for this visit.There is no height or weight on file to calculate BMI.  General Appearance: Casual, Neat and Well Groomed  Eye Contact:  Good  Speech:  Clear and Coherent  Volume:  Normal  Mood:  Anxious  Affect:  Appropriate  Thought Process:  Goal Directed  Orientation:  Full (Time, Place, and Person)  Thought Content: Logical   Suicidal Thoughts:  No  Homicidal Thoughts:  No  Memory:  WNL  Judgement:  Good  Insight:  Good  Psychomotor Activity:  Normal  Concentration:  Concentration: Good and Attention Span: Good  Recall:  Good  Fund of Knowledge: Good  Language: Good  Assets:  Desire for Improvement  ADL's:  Intact  Cognition: WNL  Prognosis:  Good    DIAGNOSES:    ICD-10-CM   1. Mixed obsessional thoughts and acts  F42.2     Receiving Psychotherapy: No    RECOMMENDATIONS:  PDMP reviewed. I provided 20 minutes of face-to-face time care during this encounter. Restart Zoloft.  She is very nervous about taking meds so we agreed to start at a very low dose.  She only ended up on 50 mg in the past which was helpful.   Start Zoloft 25 mg, 1 p.o. daily. Restart Ativan 0.5 mg, 1/2-1 p.o. twice daily as needed.  Return in 6 weeks.   Melony Overly, PA-C

## 2019-10-19 ENCOUNTER — Other Ambulatory Visit: Payer: Self-pay

## 2019-10-19 ENCOUNTER — Ambulatory Visit
Admission: RE | Admit: 2019-10-19 | Discharge: 2019-10-19 | Disposition: A | Discharge status: Home / Self Care | Payer: BC Managed Care – PPO | Source: Ambulatory Visit | Attending: Obstetrics and Gynecology | Admitting: Obstetrics and Gynecology

## 2019-10-19 DIAGNOSIS — N6314 Unspecified lump in the right breast, lower inner quadrant: Secondary | ICD-10-CM | POA: Diagnosis not present

## 2019-10-19 DIAGNOSIS — N6312 Unspecified lump in the right breast, upper inner quadrant: Secondary | ICD-10-CM | POA: Diagnosis not present

## 2019-10-19 DIAGNOSIS — N6310 Unspecified lump in the right breast, unspecified quadrant: Secondary | ICD-10-CM

## 2019-10-25 DIAGNOSIS — F411 Generalized anxiety disorder: Secondary | ICD-10-CM | POA: Diagnosis not present

## 2019-10-25 DIAGNOSIS — F429 Obsessive-compulsive disorder, unspecified: Secondary | ICD-10-CM | POA: Diagnosis not present

## 2019-11-07 ENCOUNTER — Ambulatory Visit: Payer: BC Managed Care – PPO | Admitting: Physical Therapy

## 2019-11-07 DIAGNOSIS — F411 Generalized anxiety disorder: Secondary | ICD-10-CM | POA: Diagnosis not present

## 2019-11-07 DIAGNOSIS — F429 Obsessive-compulsive disorder, unspecified: Secondary | ICD-10-CM | POA: Diagnosis not present

## 2019-11-09 ENCOUNTER — Other Ambulatory Visit: Payer: Self-pay | Admitting: Physician Assistant

## 2019-11-09 DIAGNOSIS — N83299 Other ovarian cyst, unspecified side: Secondary | ICD-10-CM | POA: Diagnosis not present

## 2019-11-11 DIAGNOSIS — N898 Other specified noninflammatory disorders of vagina: Secondary | ICD-10-CM | POA: Diagnosis not present

## 2019-11-11 DIAGNOSIS — B373 Candidiasis of vulva and vagina: Secondary | ICD-10-CM | POA: Diagnosis not present

## 2019-11-17 ENCOUNTER — Other Ambulatory Visit: Payer: Self-pay

## 2019-11-17 ENCOUNTER — Ambulatory Visit: Payer: BC Managed Care – PPO | Attending: Family Medicine | Admitting: Physical Therapy

## 2019-11-17 ENCOUNTER — Encounter: Payer: Self-pay | Admitting: Physical Therapy

## 2019-11-17 DIAGNOSIS — R252 Cramp and spasm: Secondary | ICD-10-CM | POA: Diagnosis not present

## 2019-11-17 DIAGNOSIS — R279 Unspecified lack of coordination: Secondary | ICD-10-CM | POA: Diagnosis not present

## 2019-11-17 DIAGNOSIS — M6281 Muscle weakness (generalized): Secondary | ICD-10-CM | POA: Insufficient documentation

## 2019-11-17 DIAGNOSIS — F422 Mixed obsessional thoughts and acts: Secondary | ICD-10-CM | POA: Diagnosis not present

## 2019-11-18 NOTE — Therapy (Addendum)
[facility] [address], [place], [place], [address] Phone: [phone]   Fax:  [phone]  Physical Therapy Treatment  Patient Details  Name: Abigail Rodgers MRN: [id number] Date of Birth: [birth year] Referring Provider (PT): Glenis Smoker, MD   Encounter Date: 11/17/2019   PT End of Session - 11/17/19 1616    Visit Number 2    Date for PT Re-Evaluation 01/05/20    PT Start Time 1829    PT Stop Time 1657    PT Time Calculation (min) 40 min    Activity Tolerance Patient tolerated treatment well    Behavior During Therapy [facility] for tasks assessed/performed           Past Medical History:  Diagnosis Date  . Anxiety   . Asthma   . Depression   . GERD (gastroesophageal reflux disease)     Past Surgical History:  Procedure Laterality Date  . BREAST FIBROADENOMA SURGERY  2015  . WISDOM TOOTH EXTRACTION      There were no vitals filed for this visit.   Subjective Assessment - 11/18/19 1205    Subjective I can use all the dilators and have intercourse without abdominal pain, but it is not comfortable and still very tight.    Patient Stated Goals be able to have intimicacy with partner pain free    Currently in Pain? No/denies                             [facility] Adult PT Treatment/Exercise - 11/18/19 0001      Self-Care   Other Self-Care Comments  positions handout and how to advanced dilator usage using LE movements and getting into different positions      Neuro Re-ed    Neuro Re-ed Details  TrA activation during exercises      Exercises   Exercises Lumbar      Lumbar Exercises: Stretches   Other Lumbar Stretch Exercise happy baby 3 x 30 sec      Lumbar Exercises: Supine   Bridge with March 5 reps      Lumbar Exercises: Sidelying   Clam Right;Left;10 reps    Hip Abduction Right;Left;10 reps      Lumbar Exercises: Quadruped   Madcat/Old Horse 5 reps    Other Quadruped  Lumbar Exercises rocking into plank on knees    Other Quadruped Lumbar Exercises plank in primal position                  PT Education - 11/18/19 1049    Education Details Access Code: [email or web address]    Person(s) Educated Patient    Methods Explanation;Demonstration;Verbal cues;Handout    Comprehension Verbalized understanding;Returned demonstration            PT Short Term Goals - 11/18/19 1204      PT SHORT TERM GOAL #1   Title ind with initial HEP    Status Achieved             PT Long Term Goals - 10/13/19 1030      PT LONG TERM GOAL #1   Title Pt will report she is able to orgasm when she is intimate with her partner due to improved ability to relax and improved pelvic floor strength    Time 12    Period Weeks    Status New    Target Date 01/05/20      PT LONG TERM  GOAL #2   Title Pt will report 1/3 or less on Marinoff scale    Time 12    Period Weeks    Status New    Target Date 01/05/20      PT LONG TERM GOAL #3   Title Pt will be able to perform quick flicks at least 8x in 10 seconds in order to demonstrate improved ability to coordinate and contract/relax    Time 12    Period Weeks    Status New    Target Date 01/05/20      PT LONG TERM GOAL #4   Title Pt will be ind with advanced HEP and dilator usage in order to improve pelvic floor strength and function    Time 12    Period Weeks    Status New    Target Date 01/05/20                 Plan - 11/18/19 1049    Clinical Impression Statement Pt has been doing well with dilator and making progress.  She is having less pain overall.  pt has some hip and core weakness and exercises were added today to work on strengthening for greater balance of core muscles so she is not over reliant on the pelvic floor.  Pt will benefit from skilled PT to continue to progress strength and coordination for improved quality of life.    PT Treatment/Interventions ADLs/Self Care Home  Management;Biofeedback;Cryotherapy;Electrical Stimulation;Moist Heat;Neuromuscular re-education;Therapeutic exercise;Therapeutic activities;Patient/family education;Dry needling;Manual techniques;Taping    PT Next Visit Plan f/u on dilators and positions and core strength    PT Home Exercise Plan Access Code: [email or web address] and stretch pelvic floor    Consulted and Agree with Plan of Care Patient           Patient will benefit from skilled therapeutic intervention in order to improve the following deficits and impairments:  Decreased coordination, Impaired tone, Increased fascial restricitons, Decreased range of motion, Pain, Impaired flexibility, Decreased strength  Visit Diagnosis: Cramp and spasm  Muscle weakness (generalized)  Unspecified lack of coordination     Problem List Patient Active Problem List   Diagnosis Date Noted  . OCD (obsessive compulsive disorder) 03/14/2018    Jule Ser, PT 11/18/2019, 12:10 PM  [facility] [address] [place], [place], [address] Phone: [phone]   Fax:  [phone]  Name: Abigail Rodgers MRN: [id number] Date of Birth: 01-12-1991  PHYSICAL THERAPY DISCHARGE SUMMARY  Visits from Start of Care: 2  Current functional level related to goals / functional outcomes: See above   Remaining deficits: See above   Education / Equipment: HEP  Plan: Patient agrees to discharge.  Patient goals were partially met. Patient is being discharged due to being pleased with the current functional level.  ?????     Pt called and said feeling better Gustavus Bryant, PT 12/09/19 11:59 AM

## 2019-11-18 NOTE — Patient Instructions (Signed)
Access Code: [email or web address] URL: [email or web address] Date: 11/18/2019 Prepared by: Dwana Curd  Exercises Supine Diaphragmatic Breathing - 3 x daily - 7 x weekly - 10 reps - 1 sets Alternating Single Leg Bridge - 1 x daily - 7 x weekly - 3 sets - 10 reps Standard Plank - 1 x daily - 7 x weekly - 1 sets - 10 reps - 10 sec hold Sidelying Hip Abduction - 1 x daily - 7 x weekly - 3 sets - 10 reps Side Stepping with Resistance at Ankles - 1 x daily - 7 x weekly - 3 sets - 10 reps Supine Pelvic Floor Stretch - 1 x daily - 7 x weekly - 3 reps - 1 sets - 30 sec hold Cat-Camel to Child's Pose - 1 x daily - 7 x weekly - 5 reps - 1 sets - 10 sec hold Diaphragmatic Breathing in Supported Child's Pose with Pelvic Floor Relaxation - 1 x daily - 7 x weekly - 3 sets - 10 reps Static Prone on Elbows - 1 x daily - 7 x weekly - 3 sets - 10 reps  Patient Education Office Posture

## 2019-12-01 ENCOUNTER — Encounter: Payer: Self-pay | Admitting: Physical Therapy

## 2019-12-01 DIAGNOSIS — F422 Mixed obsessional thoughts and acts: Secondary | ICD-10-CM | POA: Diagnosis not present

## 2019-12-02 ENCOUNTER — Other Ambulatory Visit: Payer: Self-pay

## 2019-12-02 ENCOUNTER — Encounter: Payer: Self-pay | Admitting: Physician Assistant

## 2019-12-02 ENCOUNTER — Ambulatory Visit (INDEPENDENT_AMBULATORY_CARE_PROVIDER_SITE_OTHER): Payer: BC Managed Care – PPO | Admitting: Physician Assistant

## 2019-12-02 DIAGNOSIS — F422 Mixed obsessional thoughts and acts: Secondary | ICD-10-CM

## 2019-12-02 MED ORDER — SERTRALINE HCL 50 MG PO TABS: 50.0000 mg | ORAL_TABLET | Freq: Every day | ORAL | 1 refills | Status: DC

## 2019-12-02 NOTE — Progress Notes (Signed)
[facility]  Patient ID: Abigail Rodgers,  MRN: [id number]  PCP: Shon Hale, MD  Date of Evaluation: 12/02/2019 Time spent:20 minutes  Chief Complaint:  Chief Complaint    Anxiety; Follow-up      HISTORY/CURRENT STATUS: HPI for f/u after starting Zoloft.   Does feel a little bit better as far as obsessive thoughts, but not enough.  Thinks she could feel even better. Has intrusive thoughts about germs. Is tolerating the Zoloft fine. Has only used the Ativan a few times and it did help.   Able to enjoy things. Energy and motivation are good.  She is able to work.  No suicidal or homicidal thoughts.  Patient denies increased energy with decreased need for sleep, no increased talkativeness, no racing thoughts, no impulsivity or risky behaviors, no increased spending, no increased libido, no grandiosity, no increased irritability or anger, and no hallucinations.  Denies dizziness, syncope, seizures, numbness, tingling, tremor, tics, unsteady gait, slurred speech, confusion. Denies muscle or joint pain, stiffness, or dystonia.  Individual Medical History/ Review of Systems: Changes? :No   Past medications for mental health diagnoses include: Zoloft, Ativan  Allergies: Penicillins  Current Medications:  Current Outpatient Medications:    LORazepam (ATIVAN) 0.5 MG tablet, Take 0.5-1 tablets (0.25-0.5 mg total) by mouth every 8 (eight) hours as needed for anxiety., Disp: 30 tablet, Rfl: 1   hydrocortisone (ANUSOL-HC) 25 MG suppository, Place 1 suppository (25 mg total) rectally at bedtime. Use for 10 days. (Patient not taking: Reported on 03/25/2019), Disp: 12 suppository, Rfl: 2   sertraline (ZOLOFT) 50 MG tablet, Take 1 tablet (50 mg total) by mouth daily., Disp: 30 tablet, Rfl: 1 Medication Side Effects: none  Family Medical/ Social History: Changes? No  MENTAL HEALTH EXAM:  There were no vitals taken for this visit.There is no height or weight on file to  calculate BMI.  General Appearance: Casual, Neat and Well Groomed  Eye Contact:  Good  Speech:  Clear and Coherent and Normal Rate  Volume:  Normal  Mood:  Anxious  Affect:  Appropriate  Thought Process:  Goal Directed  Orientation:  Full (Time, Place, and Person)  Thought Content: Logical   Suicidal Thoughts:  No  Homicidal Thoughts:  No  Memory:  WNL  Judgement:  Good  Insight:  Good  Psychomotor Activity:  Normal  Concentration:  Concentration: Good and Attention Span: Good  Recall:  Good  Fund of Knowledge: Good  Language: Good  Assets:  Desire for Improvement  ADL's:  Intact  Cognition: WNL  Prognosis:  Good    DIAGNOSES:    ICD-10-CM   1. Mixed obsessional thoughts and acts  F42.2     Receiving Psychotherapy: Yes  [place]  [facility]   RECOMMENDATIONS:  PDMP reviewed. I provided 20 minutes of face-to-face time care during this encounter. Discussed the fact that she's on a very low dose of Zoloft, we need to increase to help sx. She agrees and we'll increase slowly. Increase Zoloft to 50 mg qd.  Continue Ativan 0.5 mg, 1/2-1 p.o. twice daily as needed. Return in 4 weeks.  Melony Overly, PA-C

## 2019-12-08 ENCOUNTER — Encounter: Payer: BC Managed Care – PPO | Admitting: Physical Therapy

## 2019-12-08 ENCOUNTER — Telehealth: Payer: Self-pay | Admitting: Physician Assistant

## 2019-12-08 NOTE — Telephone Encounter (Signed)
Reviewed

## 2019-12-08 NOTE — Telephone Encounter (Signed)
Please call her in triage further.  I do not think the increased thirst and urination are from the Zoloft.  It would be unusual.  The dizziness can be and increased sweating can be side effects.  If she feels more comfortable, she can go back down to 25 mg of the Zoloft.  I do want her to see her PCP or go to an urgent care if this does not improve within the next day or so.  She needs to be evaluated to rule out diabetes.  She will obsess over that most likely but she needs to know that information so she will go be checked.

## 2019-12-08 NOTE — Telephone Encounter (Signed)
Spoke with patient about her symptoms, she did go ahead and decrease the Zoloft to 25 mg and is feeling some better. Advised her to stay at that dose and see if all her symptoms resolve. Discussed symptoms of diabetes and seeing her PCP if these continue. Instructed her to call back with an update.

## 2019-12-08 NOTE — Telephone Encounter (Signed)
Pt called and said that her sertraline has been increased and she is having side effects. She is extremly thirsty, constantly peeing, dizzy and stinging under the arms. What can be done ? Please call the pt at [phone]

## 2019-12-12 DIAGNOSIS — F422 Mixed obsessional thoughts and acts: Secondary | ICD-10-CM | POA: Diagnosis not present

## 2019-12-15 ENCOUNTER — Encounter: Payer: BC Managed Care – PPO | Admitting: Physical Therapy

## 2019-12-19 DIAGNOSIS — F422 Mixed obsessional thoughts and acts: Secondary | ICD-10-CM | POA: Diagnosis not present

## 2019-12-22 ENCOUNTER — Encounter: Payer: BC Managed Care – PPO | Admitting: Physical Therapy

## 2019-12-27 DIAGNOSIS — F422 Mixed obsessional thoughts and acts: Secondary | ICD-10-CM | POA: Diagnosis not present

## 2019-12-28 ENCOUNTER — Other Ambulatory Visit: Payer: Self-pay | Admitting: Physician Assistant

## 2019-12-28 NOTE — Telephone Encounter (Signed)
Review.

## 2020-01-10 DIAGNOSIS — N83201 Unspecified ovarian cyst, right side: Secondary | ICD-10-CM | POA: Diagnosis not present

## 2020-01-10 DIAGNOSIS — N83202 Unspecified ovarian cyst, left side: Secondary | ICD-10-CM | POA: Diagnosis not present

## 2020-01-19 DIAGNOSIS — F422 Mixed obsessional thoughts and acts: Secondary | ICD-10-CM | POA: Diagnosis not present

## 2020-01-20 ENCOUNTER — Ambulatory Visit (INDEPENDENT_AMBULATORY_CARE_PROVIDER_SITE_OTHER): Payer: BC Managed Care – PPO | Admitting: Physician Assistant

## 2020-01-20 ENCOUNTER — Other Ambulatory Visit: Payer: Self-pay

## 2020-01-20 ENCOUNTER — Encounter: Payer: Self-pay | Admitting: Physician Assistant

## 2020-01-20 DIAGNOSIS — F422 Mixed obsessional thoughts and acts: Secondary | ICD-10-CM | POA: Diagnosis not present

## 2020-01-20 MED ORDER — SERTRALINE HCL 25 MG PO TABS: 25.0000 mg | ORAL_TABLET | Freq: Every day | ORAL | 1 refills | Status: DC

## 2020-01-20 NOTE — Progress Notes (Signed)
[facility]  Patient ID: Abigail Rodgers,  MRN: [id number]  PCP: Shon Hale, MD  Date of Evaluation: 01/20/2020 Time spent:20 minutes  Chief Complaint:  Chief Complaint    Anxiety      HISTORY/CURRENT STATUS: For routine med check.   We had increased the Zoloft at the last visit.  Within a few days of increasing it, she started having increased thirst and urination.  We decreased back to 25 mg and those symptoms went away.  States she feels good at this dose and the OCD symptoms are not a problem at all. Able to enjoy things. Energy and motivation are good.  Work is going well.  She sleeps just fine. Not having any anxiety to speak of.  It is extremely rare that she takes the Ativan. No suicidal or homicidal thoughts.  She is in counseling at [facility] with Reather Laurence.  That is still very helpful.  Patient denies increased energy with decreased need for sleep, no increased talkativeness, no racing thoughts, no impulsivity or risky behaviors, no increased spending, no increased libido, no grandiosity, no increased irritability or anger, and no hallucinations.  Denies dizziness, syncope, seizures, numbness, tingling, tremor, tics, unsteady gait, slurred speech, confusion. Denies muscle or joint pain, stiffness, or dystonia.  Individual Medical History/ Review of Systems: Changes? :No   Past medications for mental health diagnoses include: Zoloft, Ativan  Allergies: Penicillins  Current Medications:  Current Outpatient Medications:  .  LORazepam (ATIVAN) 0.5 MG tablet, Take 0.5-1 tablets (0.25-0.5 mg total) by mouth every 8 (eight) hours as needed for anxiety., Disp: 30 tablet, Rfl: 1 .  hydrocortisone (ANUSOL-HC) 25 MG suppository, Place 1 suppository (25 mg total) rectally at bedtime. Use for 10 days. (Patient not taking: Reported on 03/25/2019), Disp: 12 suppository, Rfl: 2 .  sertraline (ZOLOFT) 25 MG tablet, Take 1 tablet (25 mg total) by mouth  daily., Disp: 90 tablet, Rfl: 1 Medication Side Effects: none  Family Medical/ Social History: Changes? No  MENTAL HEALTH EXAM:  There were no vitals taken for this visit.There is no height or weight on file to calculate BMI.  General Appearance: Casual, Neat and Well Groomed  Eye Contact:  Good  Speech:  Clear and Coherent and Normal Rate  Volume:  Normal  Mood:  Euthymic  Affect:  Appropriate  Thought Process:  Goal Directed  Orientation:  Full (Time, Place, and Person)  Thought Content: Logical   Suicidal Thoughts:  No  Homicidal Thoughts:  No  Memory:  WNL  Judgement:  Good  Insight:  Good  Psychomotor Activity:  Normal  Concentration:  Concentration: Good and Attention Span: Good  Recall:  Good  Fund of Knowledge: Good  Language: Good  Assets:  Desire for Improvement  ADL's:  Intact  Cognition: WNL  Prognosis:  Good    DIAGNOSES:    ICD-10-CM   1. Mixed obsessional thoughts and acts  F42.2     Receiving Psychotherapy: Yes  Eugene  At [facility]   RECOMMENDATIONS:  PDMP reviewed. I provided 20 minutes of face-to-face time care during this encounter. Continue Zoloft 25 mg, 1 p.o. daily.   Continue Ativan 0.5 mg, 1/2-1 p.o. twice daily as needed.  She takes this extremely rarely. Return in 6 months.  Melony Overly, PA-C

## 2020-01-27 DIAGNOSIS — F422 Mixed obsessional thoughts and acts: Secondary | ICD-10-CM | POA: Diagnosis not present

## 2020-02-13 DIAGNOSIS — D225 Melanocytic nevi of trunk: Secondary | ICD-10-CM | POA: Diagnosis not present

## 2020-02-13 DIAGNOSIS — L821 Other seborrheic keratosis: Secondary | ICD-10-CM | POA: Diagnosis not present

## 2020-02-13 DIAGNOSIS — D485 Neoplasm of uncertain behavior of skin: Secondary | ICD-10-CM | POA: Diagnosis not present

## 2020-02-13 DIAGNOSIS — L308 Other specified dermatitis: Secondary | ICD-10-CM | POA: Diagnosis not present

## 2020-02-13 DIAGNOSIS — L720 Epidermal cyst: Secondary | ICD-10-CM | POA: Diagnosis not present

## 2020-02-16 DIAGNOSIS — F422 Mixed obsessional thoughts and acts: Secondary | ICD-10-CM | POA: Diagnosis not present

## 2020-03-01 DIAGNOSIS — F422 Mixed obsessional thoughts and acts: Secondary | ICD-10-CM | POA: Diagnosis not present

## 2020-03-12 DIAGNOSIS — F422 Mixed obsessional thoughts and acts: Secondary | ICD-10-CM | POA: Diagnosis not present

## 2020-03-21 IMAGING — US ULTRASOUND RIGHT BREAST LIMITED
1 series · 13 of 18 positions shown · non-contrast
Comparison: Previous exams including most recent outside ultrasound
dated 12/07/2015.

CLINICAL DATA: History of surgically excised fibroadenoma. History
of probably benign fibroadenomas on outside ultrasound.

New palpable lump within the lower RIGHT breast.
EXAM:
ULTRASOUND OF THE RIGHT BREAST

[Series 1: ultrasound right breast limited · 0.05mm/px · 13 of 18 slices shown]
[im 1/18]
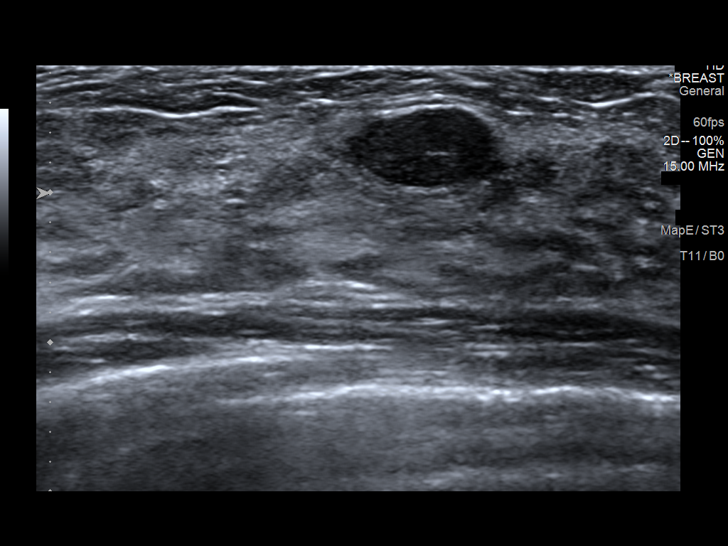
[im 3/18]
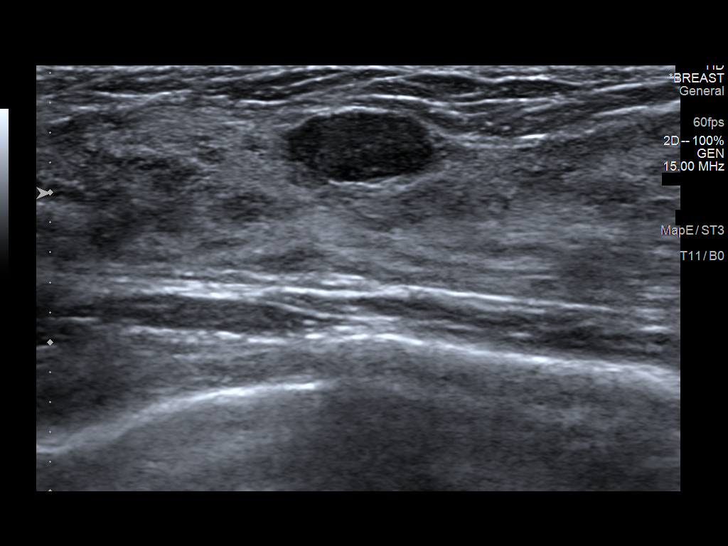
[im 4/18]
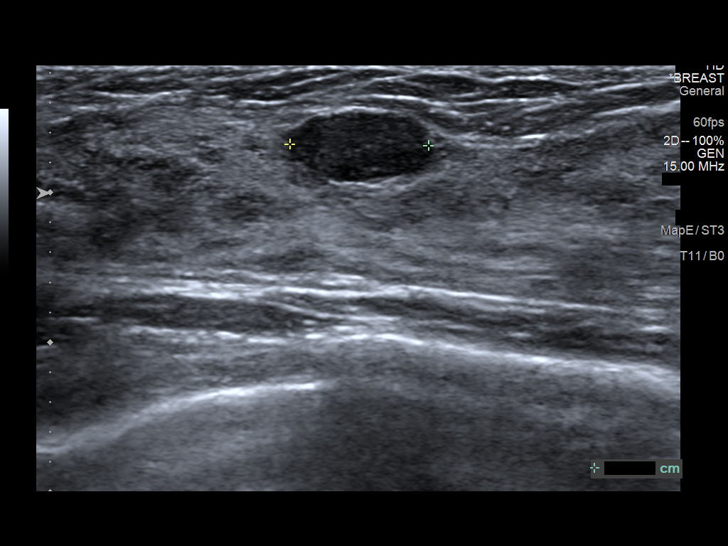
[im 5/18]
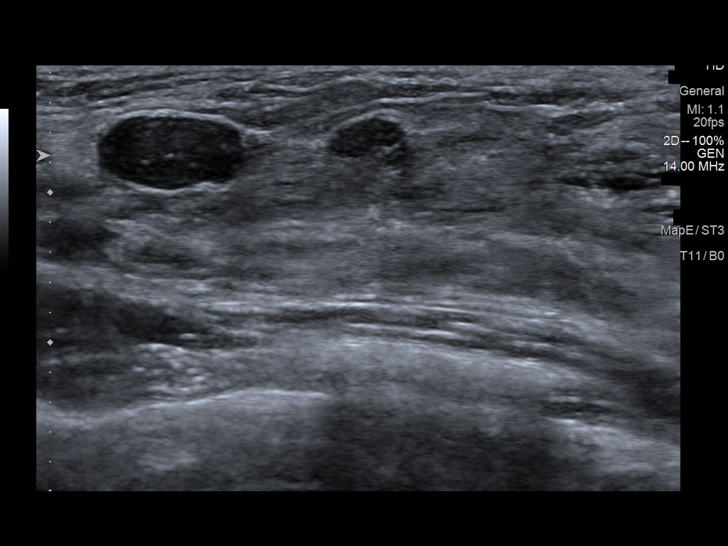
[im 7/18]
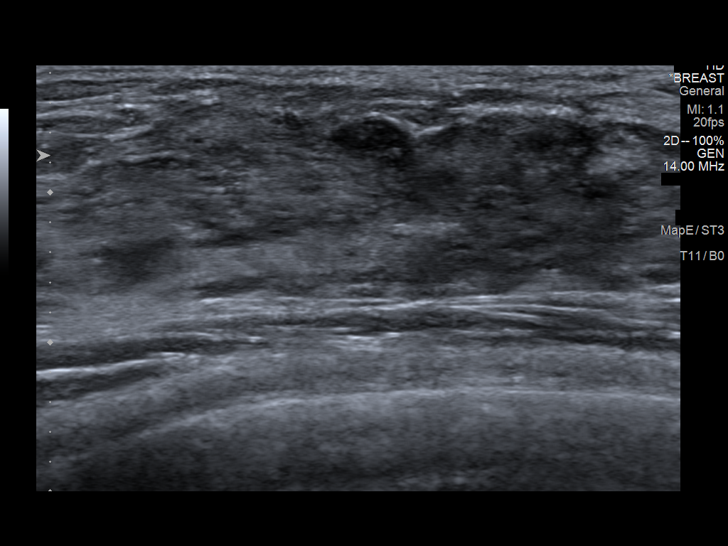
[im 8/18]
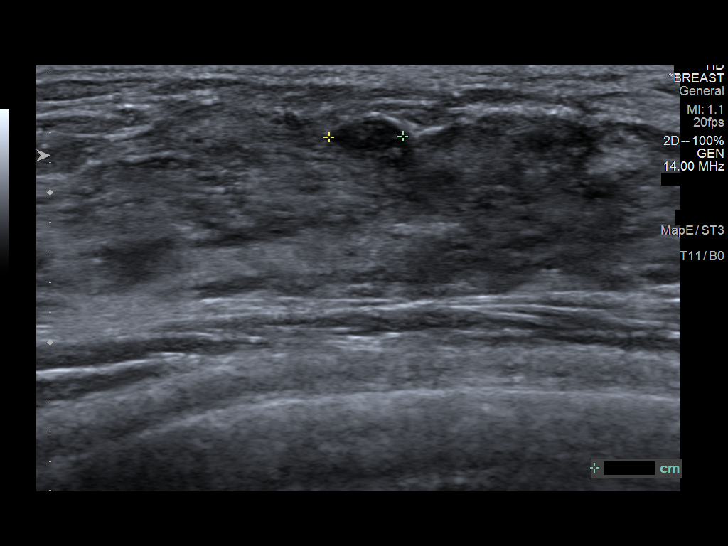
[im 10/18]
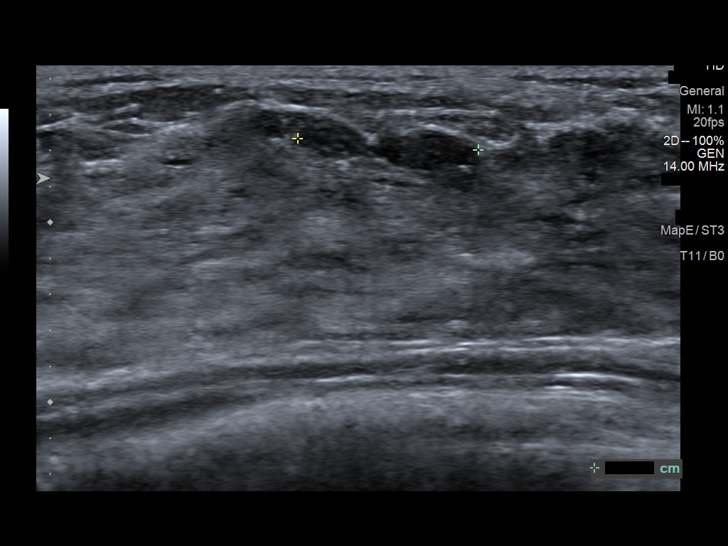
[im 11/18]
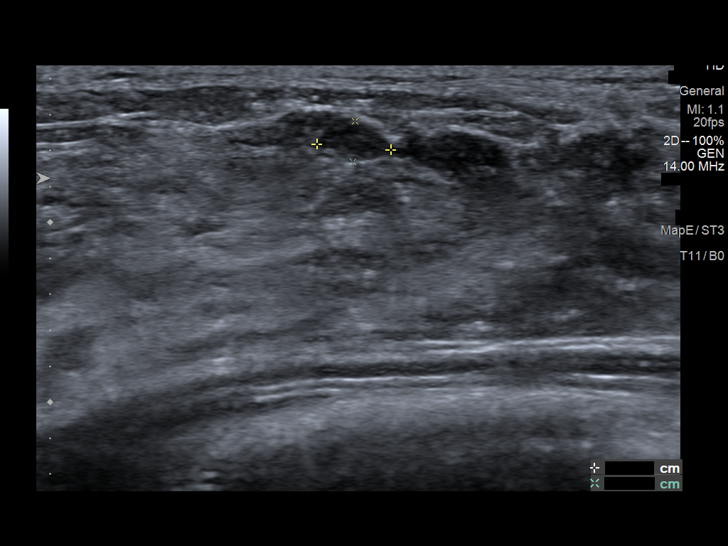
[im 12/18]
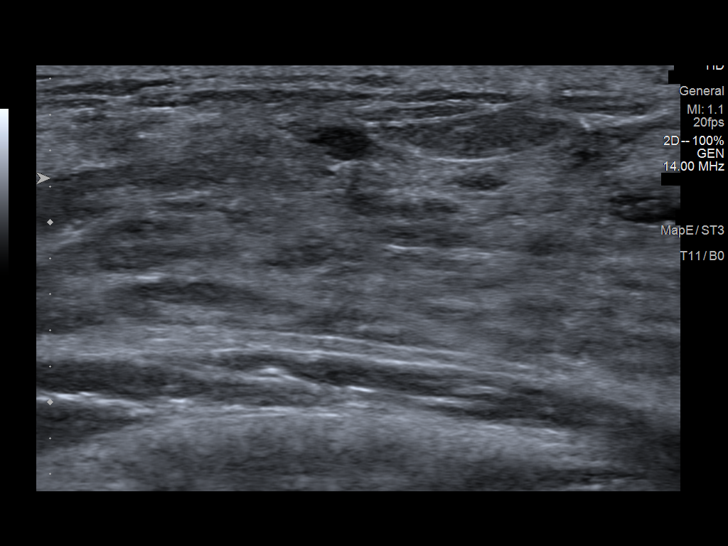
[im 14/18]
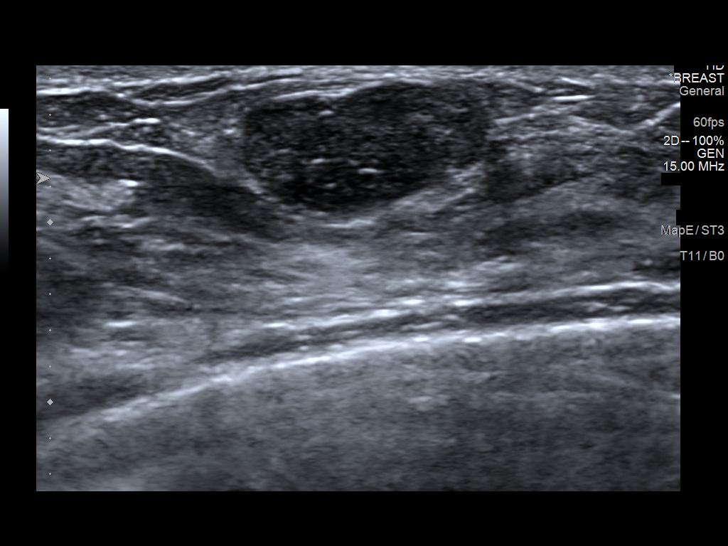
[im 15/18]
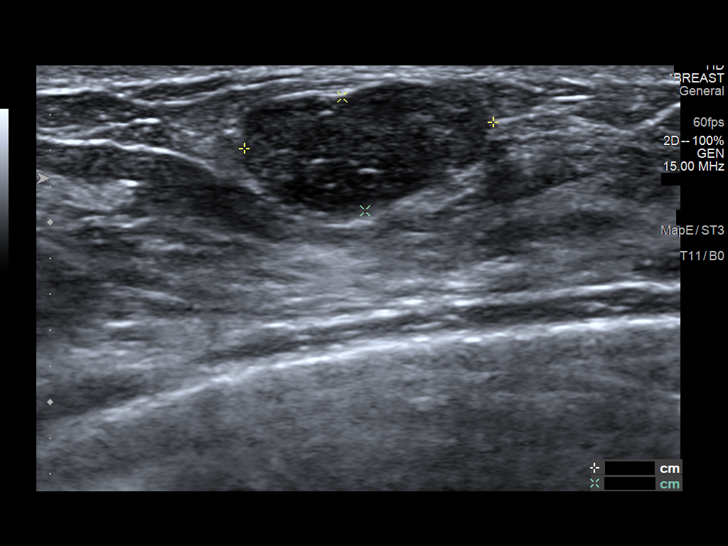
[im 16/18]
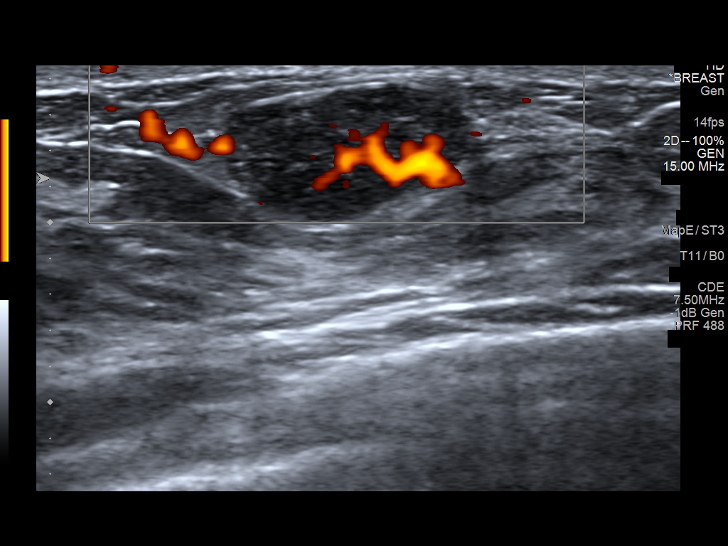
[im 18/18]
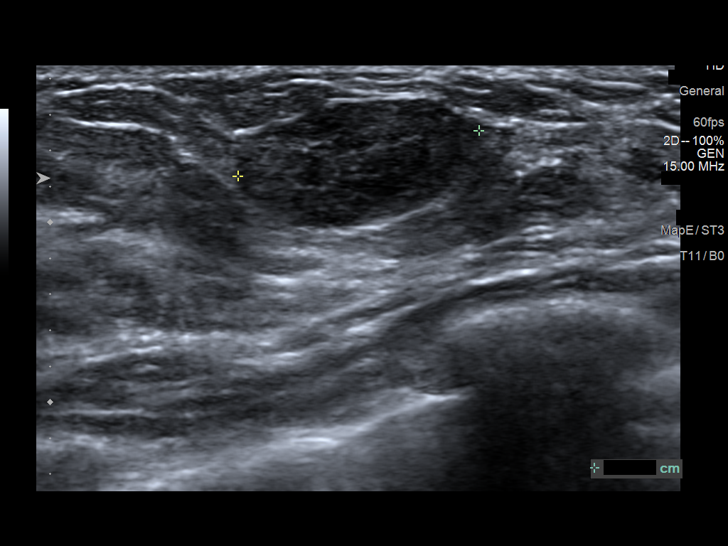

[13 of 18 positions shown; findings below may reference images not displayed]

FINDINGS: Targeted ultrasound is performed, showing an oval circumscribed
hypoechoic mass in the RIGHT breast at the 8 o'clock axis, 5 cm from
the nipple, measuring 1 cm, stable to decreased in size compared to
the outside study confirming benignity. Additional adjacent oval
circumscribed hypoechoic masses are also stable to slightly smaller
confirming benignity.

There is an additional oval circumscribed hypoechoic mass within the
RIGHT breast at the 6 o'clock axis, 4 cm from the nipple, measuring
1.4 x 0.7 x 1.4 cm, with internal vascularity, corresponding to the
new palpable lump, most suggestive of an additional benign
fibroadenoma.
IMPRESSION: 1. Probably benign fibroadenoma within the RIGHT breast at the 6
o'clock axis, 4 cm from the nipple, measuring 1.4 x 0.7 x 1.4 cm,
corresponding to a new palpable lump. Recommend follow-up RIGHT
breast ultrasound in 6 months.
2. Stable, and smaller, masses within the RIGHT breast at the 8
o'clock axis, now shown to be not significantly changed for nearly 3
years confirming benignity, presumed fibroadenomas.

RECOMMENDATION:
RIGHT breast ultrasound in 6 months.

I have discussed the findings and recommendations with the patient.
Results were also provided in writing at the conclusion of the
visit. If applicable, a reminder letter will be sent to the patient
regarding the next appointment.

BI-RADS CATEGORY  3: Probably benign.

## 2020-03-27 DIAGNOSIS — L720 Epidermal cyst: Secondary | ICD-10-CM | POA: Diagnosis not present

## 2020-03-29 DIAGNOSIS — F422 Mixed obsessional thoughts and acts: Secondary | ICD-10-CM | POA: Diagnosis not present

## 2020-04-12 DIAGNOSIS — F422 Mixed obsessional thoughts and acts: Secondary | ICD-10-CM | POA: Diagnosis not present

## 2020-05-03 DIAGNOSIS — F422 Mixed obsessional thoughts and acts: Secondary | ICD-10-CM | POA: Diagnosis not present

## 2020-05-17 DIAGNOSIS — F422 Mixed obsessional thoughts and acts: Secondary | ICD-10-CM | POA: Diagnosis not present

## 2020-06-11 DIAGNOSIS — R002 Palpitations: Secondary | ICD-10-CM | POA: Diagnosis not present

## 2020-06-25 DIAGNOSIS — L720 Epidermal cyst: Secondary | ICD-10-CM | POA: Diagnosis not present

## 2020-06-29 DIAGNOSIS — F422 Mixed obsessional thoughts and acts: Secondary | ICD-10-CM | POA: Diagnosis not present

## 2020-07-20 ENCOUNTER — Other Ambulatory Visit: Payer: Self-pay

## 2020-07-20 ENCOUNTER — Ambulatory Visit (INDEPENDENT_AMBULATORY_CARE_PROVIDER_SITE_OTHER): Payer: BC Managed Care – PPO | Admitting: Physician Assistant

## 2020-07-20 ENCOUNTER — Encounter: Payer: Self-pay | Admitting: Physician Assistant

## 2020-07-20 DIAGNOSIS — F422 Mixed obsessional thoughts and acts: Secondary | ICD-10-CM

## 2020-07-20 MED ORDER — SERTRALINE HCL 25 MG PO TABS: 25.0000 mg | ORAL_TABLET | Freq: Every day | ORAL | 1 refills | Status: DC

## 2020-07-20 NOTE — Progress Notes (Signed)
[facility]  Patient ID: Abigail Rodgers,  MRN: [id number]  PCP: Shon Hale, MD  Date of Evaluation: 07/20/2020 Time spent:20 minutes  Chief Complaint:  Chief Complaint    Anxiety; Follow-up      HISTORY/CURRENT STATUS: HPI For routine med check.  Is on low dose Zoloft and has been for quite a while.  We tried to increase the dose at 1 point but she had increased thirst and urination so decreased Zoloft back and she has had no trouble with it since.  She still has anxiety some of the time.  But feels like the Zoloft has taken the edge off and she is happy with the dose she is at.  OCD symptoms do still occur, especially obsessions about things but still not as bad as it used to be.  She is able to work without missing any days.  Sleeps well.  Patient denies loss of interest in usual activities and is able to enjoy things.  Denies decreased energy or motivation.  Appetite has not changed.  No extreme sadness, tearfulness, or feelings of hopelessness.  Denies any changes in concentration, making decisions or remembering things.  Denies suicidal or homicidal thoughts.  Patient denies increased energy with decreased need for sleep, no increased talkativeness, no racing thoughts, no impulsivity or risky behaviors, no increased spending, no increased libido, no grandiosity, no increased irritability or anger, and no hallucinations.  Denies dizziness, syncope, seizures, numbness, tingling, tremor, tics, unsteady gait, slurred speech, confusion. Denies muscle or joint pain, stiffness, or dystonia.  Individual Medical History/ Review of Systems: Changes? :No   Past medications for mental health diagnoses include: Zoloft, Ativan  Allergies: Penicillins  Current Medications:  Current Outpatient Medications:  .  LORazepam (ATIVAN) 0.5 MG tablet, Take 0.5-1 tablets (0.25-0.5 mg total) by mouth every 8 (eight) hours as needed for anxiety., Disp: 30 tablet, Rfl: 1 .   hydrocortisone (ANUSOL-HC) 25 MG suppository, Place 1 suppository (25 mg total) rectally at bedtime. Use for 10 days. (Patient not taking: Reported on 03/25/2019), Disp: 12 suppository, Rfl: 2 .  sertraline (ZOLOFT) 25 MG tablet, Take 1 tablet (25 mg total) by mouth daily., Disp: 90 tablet, Rfl: 1 Medication Side Effects: none  Family Medical/ Social History: Changes? No  MENTAL HEALTH EXAM:  There were no vitals taken for this visit.There is no height or weight on file to calculate BMI.  General Appearance: Casual, Neat and Well Groomed  Eye Contact:  Good  Speech:  Clear and Coherent and Normal Rate  Volume:  Normal  Mood:  Euthymic  Affect:  Appropriate  Thought Process:  Goal Directed and Descriptions of Associations: Intact  Orientation:  Full (Time, Place, and Person)  Thought Content: Logical   Suicidal Thoughts:  No  Homicidal Thoughts:  No  Memory:  WNL  Judgement:  Good  Insight:  Good  Psychomotor Activity:  Normal  Concentration:  Concentration: Good  Recall:  Good  Fund of Knowledge: Good  Language: Good  Assets:  Desire for Improvement  ADL's:  Intact  Cognition: WNL  Prognosis:  Good    DIAGNOSES:    ICD-10-CM   1. Mixed obsessional thoughts and acts  F42.2     Receiving Psychotherapy: Yes Reather Laurence at [facility].   RECOMMENDATIONS:  PDMP was reviewed. I provided 20 minutes of face-to-face time during this encounter, including time spent before and after the visit in records review and charting. She is doing well with the current treatment, very low  dose of Zoloft but is responding so no changes are necessary.  We briefly talked about Luvox and would change to that if her OCD symptoms and generalized anxiety worsen. Continue Zoloft 25 mg, 1 p.o. daily. Continue Ativan 0.5 mg, 1/2-1 p.o. 3 times daily as needed anxiety.  (She hardly ever takes) Continue therapy. Return in 6 months  Melony Overly, [place]

## 2020-08-10 DIAGNOSIS — F422 Mixed obsessional thoughts and acts: Secondary | ICD-10-CM | POA: Diagnosis not present

## 2020-08-31 DIAGNOSIS — F422 Mixed obsessional thoughts and acts: Secondary | ICD-10-CM | POA: Diagnosis not present

## 2020-09-14 DIAGNOSIS — F422 Mixed obsessional thoughts and acts: Secondary | ICD-10-CM | POA: Diagnosis not present

## 2020-09-24 ENCOUNTER — Other Ambulatory Visit: Payer: Self-pay | Admitting: Obstetrics and Gynecology

## 2020-09-24 DIAGNOSIS — N63 Unspecified lump in unspecified breast: Secondary | ICD-10-CM

## 2020-10-08 DIAGNOSIS — F422 Mixed obsessional thoughts and acts: Secondary | ICD-10-CM | POA: Diagnosis not present

## 2020-10-11 DIAGNOSIS — R2231 Localized swelling, mass and lump, right upper limb: Secondary | ICD-10-CM | POA: Diagnosis not present

## 2020-10-11 DIAGNOSIS — Z0000 Encounter for general adult medical examination without abnormal findings: Secondary | ICD-10-CM | POA: Diagnosis not present

## 2020-10-11 DIAGNOSIS — F419 Anxiety disorder, unspecified: Secondary | ICD-10-CM | POA: Diagnosis not present

## 2020-10-16 ENCOUNTER — Other Ambulatory Visit: Payer: Self-pay

## 2020-10-16 ENCOUNTER — Other Ambulatory Visit: Payer: Self-pay | Admitting: Obstetrics and Gynecology

## 2020-10-16 ENCOUNTER — Ambulatory Visit
Admission: RE | Admit: 2020-10-16 | Discharge: 2020-10-16 | Disposition: A | Discharge status: Home / Self Care | Payer: BC Managed Care – PPO | Source: Ambulatory Visit | Attending: Obstetrics and Gynecology | Admitting: Obstetrics and Gynecology

## 2020-10-16 DIAGNOSIS — N6314 Unspecified lump in the right breast, lower inner quadrant: Secondary | ICD-10-CM | POA: Diagnosis not present

## 2020-10-16 DIAGNOSIS — N63 Unspecified lump in unspecified breast: Secondary | ICD-10-CM

## 2020-10-16 DIAGNOSIS — N6313 Unspecified lump in the right breast, lower outer quadrant: Secondary | ICD-10-CM | POA: Diagnosis not present

## 2020-10-19 DIAGNOSIS — F422 Mixed obsessional thoughts and acts: Secondary | ICD-10-CM | POA: Diagnosis not present

## 2020-10-26 ENCOUNTER — Other Ambulatory Visit: Payer: Self-pay | Admitting: Family Medicine

## 2020-10-26 ENCOUNTER — Other Ambulatory Visit: Payer: Self-pay | Admitting: Internal Medicine

## 2020-10-26 ENCOUNTER — Ambulatory Visit
Admission: RE | Admit: 2020-10-26 | Discharge: 2020-10-26 | Disposition: A | Discharge status: Home / Self Care | Payer: BC Managed Care – PPO | Source: Ambulatory Visit | Attending: Obstetrics and Gynecology | Admitting: Obstetrics and Gynecology

## 2020-10-26 ENCOUNTER — Other Ambulatory Visit: Payer: Self-pay

## 2020-10-26 DIAGNOSIS — D241 Benign neoplasm of right breast: Secondary | ICD-10-CM | POA: Diagnosis not present

## 2020-10-26 DIAGNOSIS — N63 Unspecified lump in unspecified breast: Secondary | ICD-10-CM

## 2020-10-26 DIAGNOSIS — N6314 Unspecified lump in the right breast, lower inner quadrant: Secondary | ICD-10-CM | POA: Diagnosis not present

## 2020-11-16 DIAGNOSIS — F422 Mixed obsessional thoughts and acts: Secondary | ICD-10-CM | POA: Diagnosis not present

## 2020-12-10 DIAGNOSIS — F422 Mixed obsessional thoughts and acts: Secondary | ICD-10-CM | POA: Diagnosis not present

## 2021-01-04 DIAGNOSIS — F422 Mixed obsessional thoughts and acts: Secondary | ICD-10-CM | POA: Diagnosis not present

## 2021-01-13 ENCOUNTER — Other Ambulatory Visit: Payer: Self-pay | Admitting: Physician Assistant

## 2021-01-14 NOTE — Telephone Encounter (Signed)
90 day ok?

## 2021-01-25 ENCOUNTER — Other Ambulatory Visit: Payer: Self-pay

## 2021-01-25 ENCOUNTER — Encounter: Payer: Self-pay | Admitting: Physician Assistant

## 2021-01-25 ENCOUNTER — Ambulatory Visit: Payer: BC Managed Care – PPO | Admitting: Physician Assistant

## 2021-01-25 DIAGNOSIS — F422 Mixed obsessional thoughts and acts: Secondary | ICD-10-CM | POA: Diagnosis not present

## 2021-01-25 DIAGNOSIS — F411 Generalized anxiety disorder: Secondary | ICD-10-CM | POA: Diagnosis not present

## 2021-01-25 MED ORDER — LORAZEPAM 0.5 MG PO TABS: 0.2500 mg | ORAL_TABLET | Freq: Three times a day (TID) | ORAL | 1 refills | Status: DC | PRN

## 2021-01-25 MED ORDER — SERTRALINE HCL 25 MG PO TABS: 37.5000 mg | ORAL_TABLET | Freq: Every day | ORAL | 0 refills | Status: DC

## 2021-01-25 NOTE — Progress Notes (Signed)
[facility]  Patient ID: Abigail Rodgers,  MRN: [id number]  PCP: Shon Hale, MD  Date of Evaluation: 01/25/2021 Time spent:20 minutes  Chief Complaint:  Chief Complaint   Anxiety; Follow-up      HISTORY/CURRENT STATUS: HPI For routine med check.  Still has some anxiety, especially when she travels. Went on a beach trip with a friend and had a PA.  That was not fun.  She does not take the Ativan.  Has always been nervous to take it but does not want a repeat of a 5-day panic attack that she had several years ago in [place].  She will sometimes know what the triggers can be.  Is having trouble with her stomach as well, thinks she has IBS and nothing more serious.  Has been diagnosed in the past with that or at least everything else ruled out.  The symptoms of crampy diarrhea have been a little worse for the past 3 months or so.  No new life stressors though.    Work is going well.  She sleeps well.  Able to enjoy things.  Energy and motivation are good.  She does not cry easily.  Appetite is normal and weight is stable.  Personal hygiene is normal.  No suicidal or homicidal thoughts.  Denies dizziness, syncope, seizures, numbness, tingling, tremor, tics, unsteady gait, slurred speech, confusion. Denies muscle or joint pain, stiffness, or dystonia.  Individual Medical History/ Review of Systems: Changes? :No   Past medications for mental health diagnoses include: Zoloft, Ativan  Allergies: Penicillins  Current Medications:  Current Outpatient Medications:    hydrocortisone (ANUSOL-HC) 25 MG suppository, Place 1 suppository (25 mg total) rectally at bedtime. Use for 10 days. (Patient not taking: No sig reported), Disp: 12 suppository, Rfl: 2   LORazepam (ATIVAN) 0.5 MG tablet, Take 0.5-1 tablets (0.25-0.5 mg total) by mouth every 8 (eight) hours as needed for anxiety., Disp: 30 tablet, Rfl: 1   sertraline (ZOLOFT) 25 MG tablet, Take 1.5 tablets (37.5 mg total) by  mouth daily., Disp: 135 tablet, Rfl: 0 Medication Side Effects: none  Family Medical/ Social History: Changes? No  MENTAL HEALTH EXAM:  There were no vitals taken for this visit.There is no height or weight on file to calculate BMI.  General Appearance: Casual, Neat and Well Groomed  Eye Contact:  Good  Speech:  Clear and Coherent and Normal Rate  Volume:  Normal  Mood:  Euthymic  Affect:  Appropriate  Thought Process:  Goal Directed and Descriptions of Associations: Circumstantial  Orientation:  Full (Time, Place, and Person)  Thought Content: Logical   Suicidal Thoughts:  No  Homicidal Thoughts:  No  Memory:  WNL  Judgement:  Good  Insight:  Good  Psychomotor Activity:  Normal  Concentration:  Concentration: Good  Recall:  Good  Fund of Knowledge: Good  Language: Good  Assets:  Desire for Improvement  ADL's:  Intact  Cognition: WNL  Prognosis:  Good    DIAGNOSES:    ICD-10-CM   1. Mixed obsessional thoughts and acts  F42.2     2. Generalized anxiety disorder  F41.1        Receiving Psychotherapy: Yes Eugene Naughton at [facility].   RECOMMENDATIONS:  PDMP was reviewed.  Last Ativan filled 10/14/2019.  No other controlled substances. I provided 20 minutes of face to face time during this encounter, including time spent before and after the visit in records review, medical decision making, and charting.  We discussed her  symptoms.  I recommend increasing the Zoloft.  We did go up to 50 mg at one point but she did not tolerate that well.  We agreed to increase the dose slightly and go slow if we need to go higher. I recommend that she take the Ativan when needed.  She should not be shy about taking it.  At least have 1 with her at all times so that if she does have a panic attack like at the beach she can take at least 1/2 pill and that will help her get through the panic attack.  Her last prescription is old so I will send in a new 1. Increase Zoloft to 37.5 mg  daily. Continue Ativan 0.5 mg, 1/2-1 p.o. 3 times daily as needed anxiety.  (She hardly ever takes) Continue therapy. Return in 6 weeks.  Melony Overly, PA-C

## 2021-02-01 DIAGNOSIS — F422 Mixed obsessional thoughts and acts: Secondary | ICD-10-CM | POA: Diagnosis not present

## 2021-02-15 DIAGNOSIS — F422 Mixed obsessional thoughts and acts: Secondary | ICD-10-CM | POA: Diagnosis not present

## 2021-03-11 DIAGNOSIS — F422 Mixed obsessional thoughts and acts: Secondary | ICD-10-CM | POA: Diagnosis not present

## 2021-03-15 ENCOUNTER — Ambulatory Visit: Payer: BC Managed Care – PPO | Admitting: Physician Assistant

## 2021-03-27 ENCOUNTER — Other Ambulatory Visit: Payer: Self-pay | Admitting: Physician Assistant

## 2021-03-27 NOTE — Telephone Encounter (Signed)
90 day ok?

## 2021-03-28 IMAGING — US US BREAST*R* LIMITED INC AXILLA
1 series · 7 of 7 positions shown · non-contrast
Comparison: Previous exam(s).

CLINICAL DATA: 28-year-old female for 1 year follow-up of RIGHT
breast mass. The patient describes no significant change from the
prior study. History of multiple fibroadenomas in the past.

EXAM:
ULTRASOUND OF THE RIGHT BREAST

[Series 1: us breast*right* limited inc axilla · 0.06mm/px · 7 of 7 slices shown]
[im 1/7]
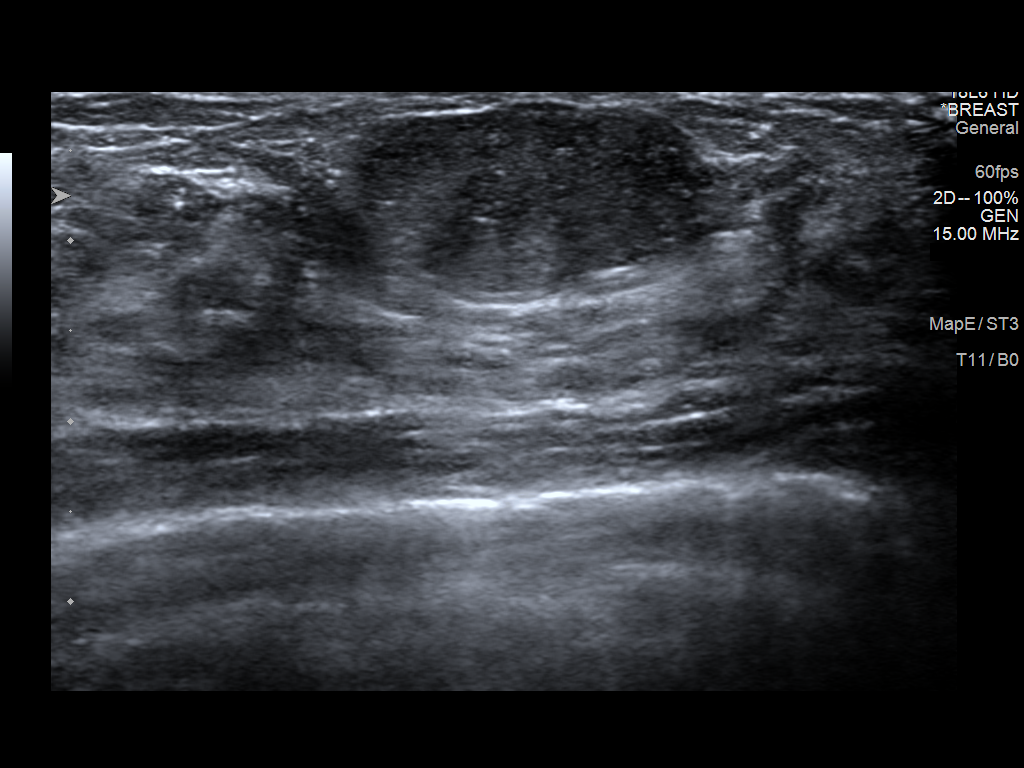
[im 2/7]
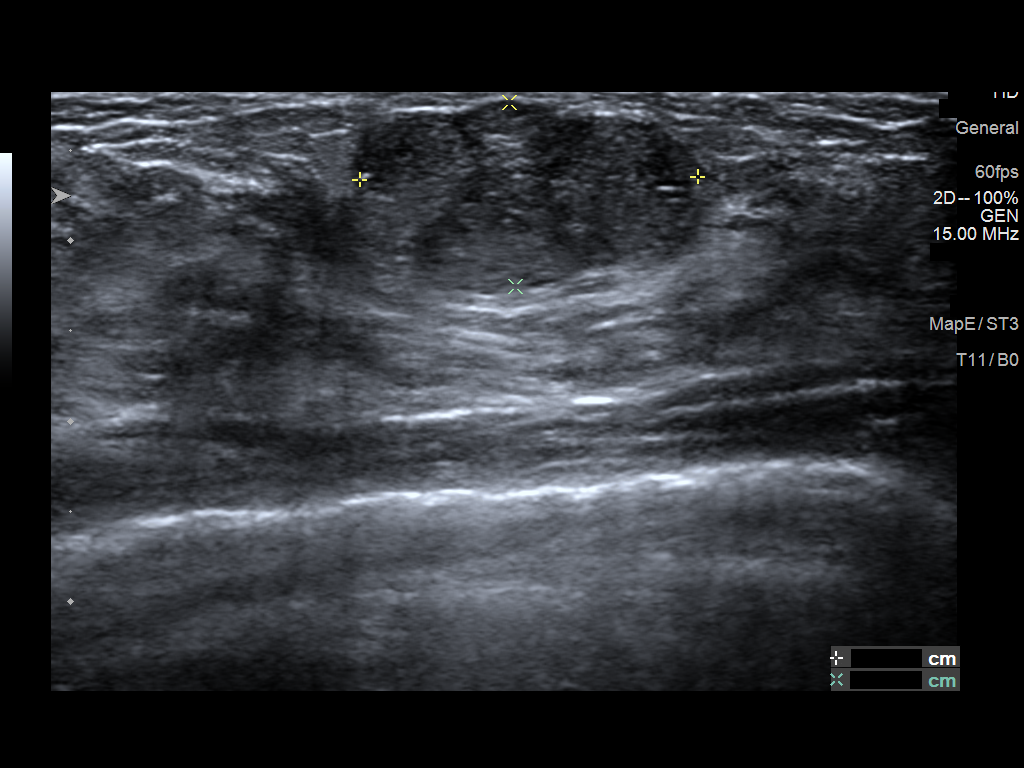
[im 3/7]
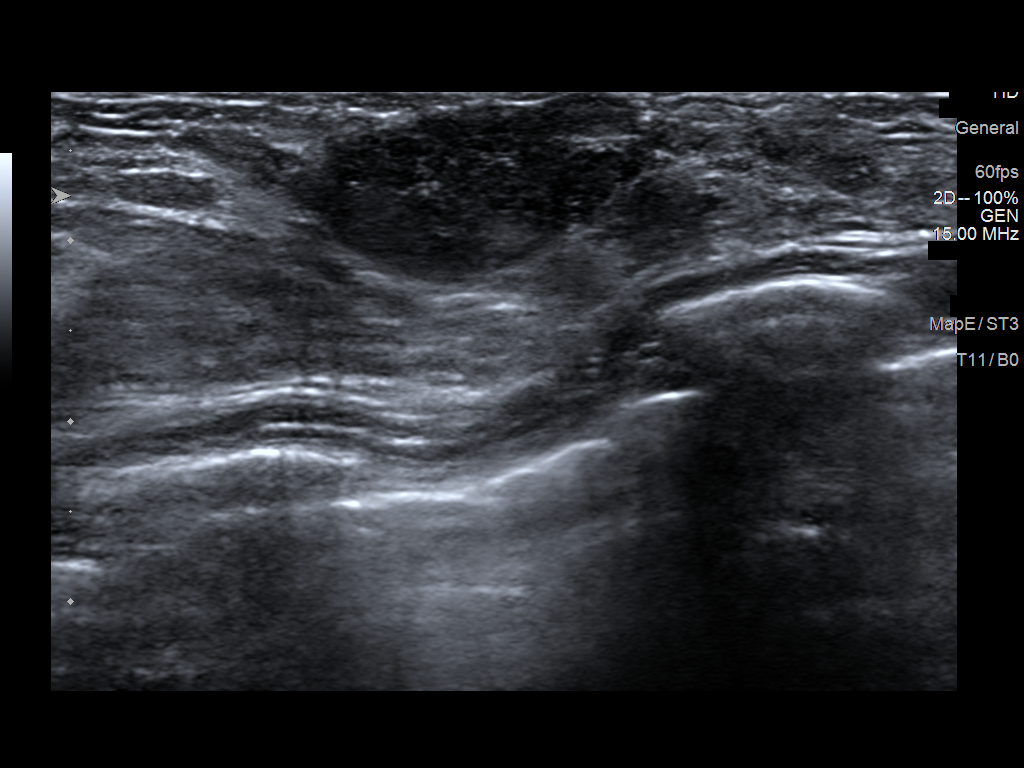
[im 4/7]
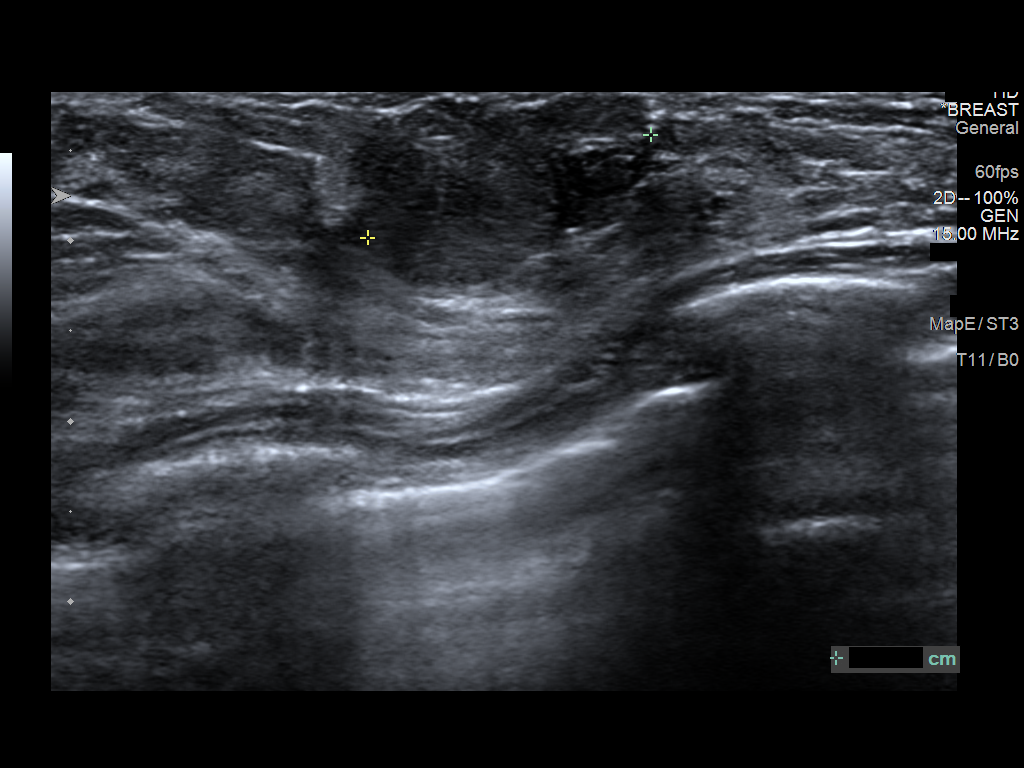
[im 5/7]
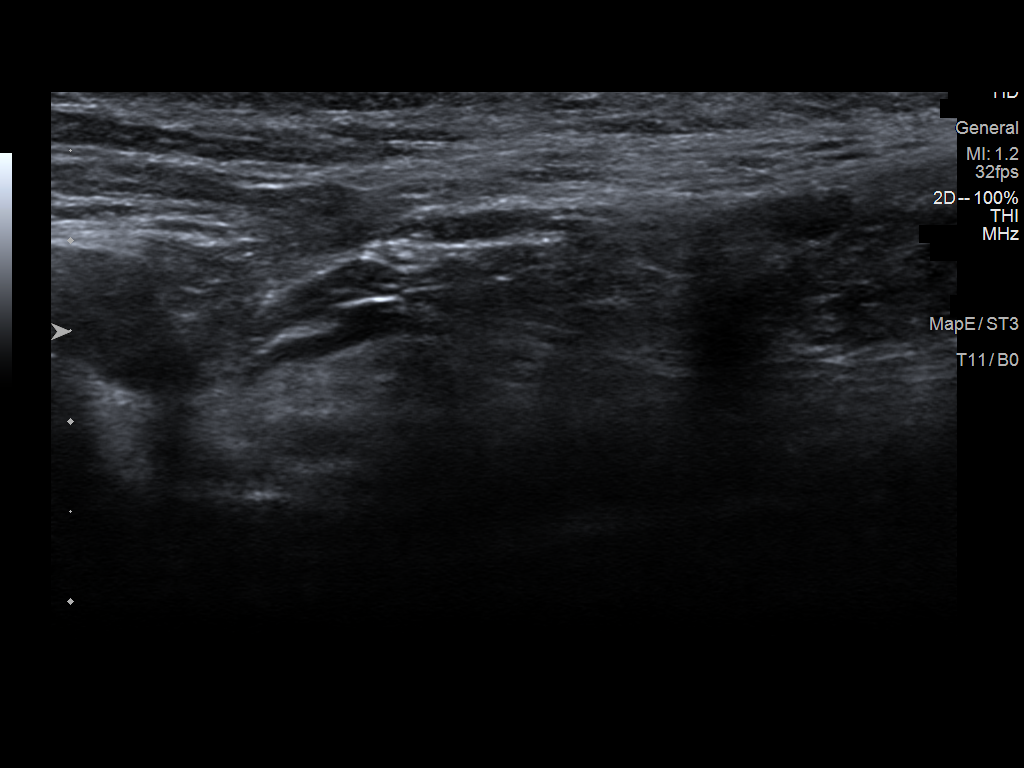
[im 6/7]
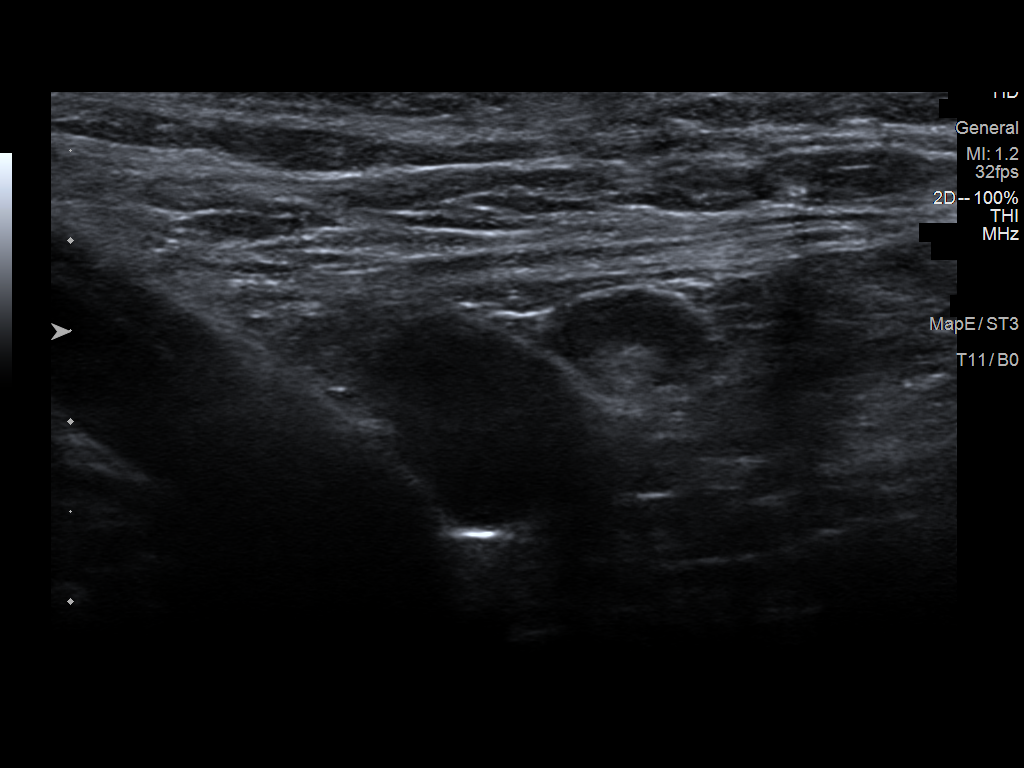
[im 7/7]
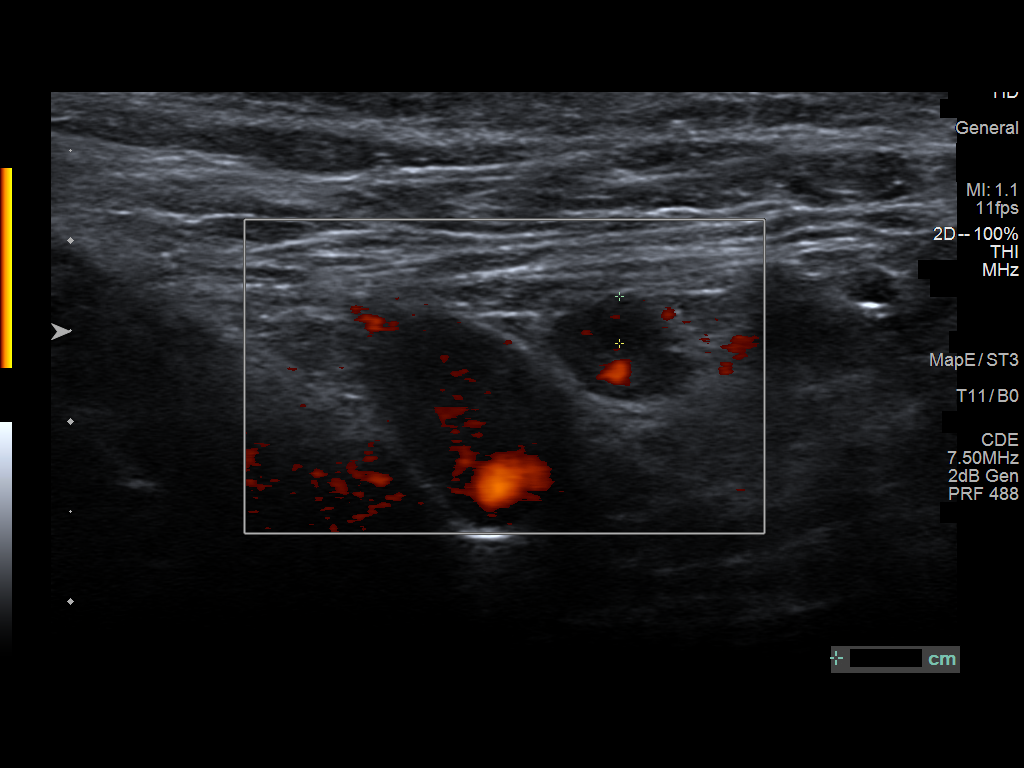

[7 of 7 positions shown; findings below may reference images not displayed]

FINDINGS: Targeted ultrasound is performed, showing a 1.8 x 1 x 1.6 cm
circumscribed oval hypoechoic parallel mass at the 6 o'clock
position of the RIGHT breast 4 cm from the nipple, previously
measuring 1.6 x 0.8 x 1.3 cm.
IMPRESSION: Slight increase in size of LOWER RIGHT breast mass, likely normal
variation of a fibroadenoma. Ultrasound follow-up in 1 year is
recommended.

RECOMMENDATION:
RIGHT breast ultrasound in 1 year. The patient was instructed to
notify her primary physician or our office if the LOWER RIGHT breast
mass increases in size.

I have discussed the findings and recommendations with the patient.
If applicable, a reminder letter will be sent to the patient
regarding the next appointment.

BI-RADS CATEGORY  3: Probably benign.

## 2021-04-15 DIAGNOSIS — F422 Mixed obsessional thoughts and acts: Secondary | ICD-10-CM | POA: Diagnosis not present

## 2021-04-26 ENCOUNTER — Ambulatory Visit: Payer: BC Managed Care – PPO | Admitting: Physician Assistant

## 2021-04-26 ENCOUNTER — Encounter: Payer: Self-pay | Admitting: Physician Assistant

## 2021-04-26 ENCOUNTER — Other Ambulatory Visit: Payer: Self-pay

## 2021-04-26 DIAGNOSIS — F422 Mixed obsessional thoughts and acts: Secondary | ICD-10-CM | POA: Diagnosis not present

## 2021-04-26 DIAGNOSIS — F411 Generalized anxiety disorder: Secondary | ICD-10-CM

## 2021-04-26 MED ORDER — SERTRALINE HCL 25 MG PO TABS
50.0000 mg | ORAL_TABLET | Freq: Every day | ORAL | 0 refills | Status: DC
Start: 2021-04-26 — End: 2021-06-07

## 2021-04-26 NOTE — Progress Notes (Signed)
[facility]  Patient ID: Abigail Rodgers,  MRN: [id number]  PCP: Shon Hale, MD  Date of Evaluation: 04/26/2021 Time spent:20 minutes  Chief Complaint:  Chief Complaint   Anxiety; Follow-up     HISTORY/CURRENT STATUS: HPI For routine med check.  Two months ago, we increased the Zoloft up to 37.5 mg daily.  She does not want to increase quickly, in fact she wishes she did not have to take the medication at all.  She does still have anxiety, not so much during the day but woke up in the middle of the night when she was at her parents house for the holidays.  A panic attack woke her up and it lasted for an hour or so.  She felt like something bad was going to happen and she could not get that out of her head.  She was finally able to go back to sleep.  She did not take the Ativan, is still very hesitant to take it.  Reports being a "hermit" as she does not like to go out, around other people because of the anxiety.  Rarely eats out because of concerns of contaminated food.  No increased handwashing or other compulsions.   States that the IBS has been better over the past month or 2.  She has been eliminating certain foods out of her diet which seem to have helped the IBS.  Not depressed.  She does not enjoy much of anything and stays home except for work, but it is because of the anxiety, not depression.  Denies decreased energy or motivation.  Appetite has not changed.  No extreme sadness, tearfulness, or feelings of hopelessness.  Denies any changes in concentration, making decisions or remembering things. Most of the time she sleeps well. Denies suicidal or homicidal thoughts.  Denies dizziness, syncope, seizures, numbness, tingling, tremor, tics, unsteady gait, slurred speech, confusion. Denies muscle or joint pain, stiffness, or dystonia.  Individual Medical History/ Review of Systems: Changes? :No   Past medications for mental health diagnoses include: Zoloft,  Ativan  Allergies: Penicillins  Current Medications:  Current Outpatient Medications:    LORazepam (ATIVAN) 0.5 MG tablet, Take 0.5-1 tablets (0.25-0.5 mg total) by mouth every 8 (eight) hours as needed for anxiety., Disp: 30 tablet, Rfl: 1   hydrocortisone (ANUSOL-HC) 25 MG suppository, Place 1 suppository (25 mg total) rectally at bedtime. Use for 10 days. (Patient not taking: Reported on 03/25/2019), Disp: 12 suppository, Rfl: 2   sertraline (ZOLOFT) 25 MG tablet, Take 2 tablets (50 mg total) by mouth daily., Disp: 90 tablet, Rfl: 0 Medication Side Effects: none  Family Medical/ Social History: Changes? No  MENTAL HEALTH EXAM:  There were no vitals taken for this visit.There is no height or weight on file to calculate BMI.  General Appearance: Casual, Neat and Well Groomed  Eye Contact:  Good  Speech:  Clear and Coherent and Normal Rate  Volume:  Normal  Mood:  Anxious  Affect:  Congruent  Thought Process:  Goal Directed and Descriptions of Associations: Circumstantial  Orientation:  Full (Time, Place, and Person)  Thought Content: Logical   Suicidal Thoughts:  No  Homicidal Thoughts:  No  Memory:  WNL  Judgement:  Good  Insight:  Good  Psychomotor Activity:  Normal  Concentration:  Concentration: Good  Recall:  Good  Fund of Knowledge: Good  Language: Good  Assets:  Desire for Improvement  ADL's:  Intact  Cognition: WNL  Prognosis:  Good  DIAGNOSES:    ICD-10-CM   1. Generalized anxiety disorder  F41.1     2. Mixed obsessional thoughts and acts  F42.2         Receiving Psychotherapy: Yes Reather Laurence at [facility].   RECOMMENDATIONS:  PDMP was reviewed.  Last Ativan filled 01/25/2021. I provided 20 minutes of face to face time during this encounter, including time spent before and after the visit in records review, medical decision making, counseling pertinent to today's visit, and charting.  We had a long discussion about serotonin receptors  and how they are all over her body, not just in the brain.  I think the IBS is better, at least in part, due to to the increased Zoloft.  Of course changing her diet has helped also.  She will Google gut/brain connection and do more research. I strongly recommend increasing the Zoloft.  She is willing to try it.  In the past, she never took more than 50 mg and when she got to that dose, she said she felt "too good."  Basically it sounds like she just wanted to do more, go out and do things with friends and family, not having manic symptoms. She has Zoloft 25 mg at home so she will take 2 of those, I have asked her to stay on that dose for 2 weeks before she decides it might not be working or it is making her feel weird.  She can then go back to 37.5 mg daily if she needs to.  If she stays on the 50 mg then the next prescription will be for that dose. Increase Zoloft to 50 mg daily. Continue Ativan 0.5 mg, 1/2-1 p.o. 3 times daily as needed anxiety.  (She hardly ever takes) Continue therapy. Return in 6 weeks.  Melony Overly, PA-C

## 2021-05-24 DIAGNOSIS — F422 Mixed obsessional thoughts and acts: Secondary | ICD-10-CM | POA: Diagnosis not present

## 2021-06-07 ENCOUNTER — Ambulatory Visit: Payer: BC Managed Care – PPO | Admitting: Physician Assistant

## 2021-06-07 ENCOUNTER — Encounter: Payer: Self-pay | Admitting: Physician Assistant

## 2021-06-07 ENCOUNTER — Other Ambulatory Visit: Payer: Self-pay

## 2021-06-07 DIAGNOSIS — F411 Generalized anxiety disorder: Secondary | ICD-10-CM

## 2021-06-07 DIAGNOSIS — F410 Panic disorder [episodic paroxysmal anxiety] without agoraphobia: Secondary | ICD-10-CM

## 2021-06-07 DIAGNOSIS — F422 Mixed obsessional thoughts and acts: Secondary | ICD-10-CM

## 2021-06-07 MED ORDER — SERTRALINE HCL 25 MG PO TABS: 37.5000 mg | ORAL_TABLET | Freq: Every day | ORAL | 0 refills | Status: DC

## 2021-06-07 NOTE — Progress Notes (Signed)
[facility]  Patient ID: Abigail Rodgers,  MRN: [id number]  PCP: Shon Hale, MD  Date of Evaluation: 06/07/2021 Time spent:20 minutes  Chief Complaint:  Chief Complaint   Anxiety; Follow-up      HISTORY/CURRENT STATUS: HPI For routine med check.  Had to decrease the Zoloft back to 37.5 mg b/c of GI sx. It wasn't really helping the anxiety yet, hadn't been on it long enough. Has been having more GI symptoms and thinks it might have increased when we bumped up the Zoloft 50 mg 6 weeks ago.  It is difficult to say for sure, because she has had problems with upper and lower GI tract for a long time, has even had several endoscopies/colonoscopies that were negative except for a hiatal hernia.  We will be seeing a gastroenterologist the week of March 6.  She does get nauseated after she takes the Zoloft in the mornings some days but not every day.  She does take it with a glass of water and eats breakfast as well.  Still has a lot of anxiety off and on.  Describes the panic attack as being in "[place] mode" where she is shaking all over and cannot stop.  That happened several weeks back.  She took half an Ativan and it was effective.  She is still extremely hesitant to take them.  She did take 1/2 pill this morning because of the anxiety.  It is helpful when she takes it.  Even when she is not having a panic attack she has a generalized sense of unease and gets overwhelmed easily.  Patient denies loss of interest in usual activities and is able to enjoy things.  Denies decreased energy or motivation.  Work is very busy and sometimes stressful.  ADLs are within normal limits.  Personal hygiene is normal.  She sleeps very well.  Appetite has not changed.  No extreme sadness, tearfulness, or feelings of hopelessness.  Denies any changes in concentration, making decisions or remembering things.  Denies suicidal or homicidal thoughts.  Denies dizziness, syncope, seizures,  numbness, tingling, tremor, tics, unsteady gait, slurred speech, confusion. Denies muscle or joint pain, stiffness, or dystonia.  Individual Medical History/ Review of Systems: Changes? :Yes  see HPI.  Past medications for mental health diagnoses include: Zoloft, Ativan  Allergies: Penicillins  Current Medications:  Current Outpatient Medications:    LORazepam (ATIVAN) 0.5 MG tablet, Take 0.5-1 tablets (0.25-0.5 mg total) by mouth every 8 (eight) hours as needed for anxiety., Disp: 30 tablet, Rfl: 1   hydrocortisone (ANUSOL-HC) 25 MG suppository, Place 1 suppository (25 mg total) rectally at bedtime. Use for 10 days. (Patient not taking: Reported on 03/25/2019), Disp: 12 suppository, Rfl: 2   sertraline (ZOLOFT) 25 MG tablet, Take 1.5 tablets (37.5 mg total) by mouth daily., Disp: 135 tablet, Rfl: 0 Medication Side Effects: none  Family Medical/ Social History: Changes? No  MENTAL HEALTH EXAM:  There were no vitals taken for this visit.There is no height or weight on file to calculate BMI.  General Appearance: Casual, Neat and Well Groomed  Eye Contact:  Good  Speech:  Clear and Coherent and Normal Rate  Volume:  Normal  Mood:  Anxious  Affect:  Congruent and Anxious  Thought Process:  Goal Directed and Descriptions of Associations: Circumstantial  Orientation:  Full (Time, Place, and Person)  Thought Content: Logical   Suicidal Thoughts:  No  Homicidal Thoughts:  No  Memory:  WNL  Judgement:  Good  Insight:  Good  Psychomotor Activity:  Normal  Concentration:  Concentration: Good  Recall:  Good  Fund of Knowledge: Good  Language: Good  Assets:  Desire for Improvement  ADL's:  Intact  Cognition: WNL  Prognosis:  Good    DIAGNOSES:    ICD-10-CM   1. Generalized anxiety disorder  F41.1     2. Mixed obsessional thoughts and acts  F42.2     3. Panic attacks  F41.0         Receiving Psychotherapy: Yes Eugene Naughton at [facility].   RECOMMENDATIONS:   PDMP was reviewed.  Last Ativan filled 01/25/2021. I provided 20 minutes of face to face time during this encounter, including time spent before and after the visit in records review, medical decision making, counseling pertinent to today's visit, and charting.  We discussed different options, adding BuSpar, changing Zoloft to either Lexapro or Celexa or Paxil.  We briefly discussed Luvox but she does not have any trouble sleeping and is nervous that it might make her too groggy the next day.  Because she will be seeing the gastroenterologist in about 10 days, I recommend making no major changes in meds that may worsen GI symptoms which would cloud the picture of those symptoms.  She understands and agrees.  Continue Zoloft 37.5 mg daily.  Change from morning dosing to taking it with her evening meal.  Hopefully if she gets nauseated she will not feel it because she will be asleep. Continue Ativan 0.5 mg, 1/2-1 p.o. 3 times daily as needed anxiety.  (She hardly ever takes) I have encouraged her to take it more often to help as a rescue. Continue therapy. Return in 4 weeks, after GI visit and tentative plan of treatment.  Melony Overly, PA-C

## 2021-06-14 DIAGNOSIS — F422 Mixed obsessional thoughts and acts: Secondary | ICD-10-CM | POA: Diagnosis not present

## 2021-06-17 ENCOUNTER — Ambulatory Visit: Payer: BC Managed Care – PPO | Admitting: Gastroenterology

## 2021-06-17 ENCOUNTER — Encounter: Payer: Self-pay | Admitting: Gastroenterology

## 2021-06-17 ENCOUNTER — Other Ambulatory Visit (INDEPENDENT_AMBULATORY_CARE_PROVIDER_SITE_OTHER): Payer: BC Managed Care – PPO

## 2021-06-17 VITALS — BP 120/68 | HR 82 | Ht 65.0 in | Wt 116.0 lb

## 2021-06-17 DIAGNOSIS — R1084 Generalized abdominal pain: Secondary | ICD-10-CM | POA: Insufficient documentation

## 2021-06-17 DIAGNOSIS — R194 Change in bowel habit: Secondary | ICD-10-CM

## 2021-06-17 LAB — TSH: TSH: 0.73 u[IU]/mL (ref 0.35–5.50)

## 2021-06-17 LAB — COMPREHENSIVE METABOLIC PANEL
ALT: 10 U/L (ref 0–35)
AST: 13 U/L (ref 0–37)
Albumin: 4.4 g/dL (ref 3.5–5.2)
Alkaline Phosphatase: 41 U/L (ref 39–117)
BUN: 12 mg/dL (ref 6–23)
CO2: 27 mEq/L (ref 19–32)
Calcium: 9.1 mg/dL (ref 8.4–10.5)
Chloride: 105 mEq/L (ref 96–112)
Creatinine, Ser: 0.72 mg/dL (ref 0.40–1.20)
GFR: 110.71 mL/min (ref >60.00)
Glucose, Bld: 73 mg/dL (ref 70–99)
Potassium: 3.6 mEq/L (ref 3.5–5.1)
Sodium: 140 mEq/L (ref 135–145)
Total Bilirubin: 0.5 mg/dL (ref 0.2–1.2)
Total Protein: 6.9 g/dL (ref 6.0–8.3)

## 2021-06-17 LAB — SEDIMENTATION RATE: Sed Rate: 3 mm/hr (ref 0–20)

## 2021-06-17 LAB — CBC
HCT: 40.3 % (ref 36.0–46.0)
Hemoglobin: 14 g/dL (ref 12.0–15.0)
MCHC: 34.6 g/dL (ref 30.0–36.0)
MCV: 94.9 fl (ref 78.0–100.0)
Platelets: 172 10*3/uL (ref 150.0–400.0)
RBC: 4.25 Mil/uL (ref 3.87–5.11)
RDW: 13 % (ref 11.5–15.5)
WBC: 5.4 10*3/uL (ref 4.0–10.5)

## 2021-06-17 LAB — C-REACTIVE PROTEIN: CRP: <1 mg/dL (ref 0.5–20.0)

## 2021-06-17 NOTE — Patient Instructions (Addendum)
Your provider has requested that you go to the basement level for lab work before leaving today. Press "B" on the elevator. The lab is located at the first door on the left as you exit the elevator. ? ?Start Benefiber 2 teaspoons in 8 ounces of liquid daily. ? ?If you are age 33 or younger, your body mass index should be between 19-25. Your Body mass index is 19.3 kg/m?Marland Kitchen If this is out of the aformentioned range listed, please consider follow up with your Primary Care Provider.  ? ?________________________________________________________ ? ?The [facility] providers would like to encourage you to use [facility] to communicate with providers for non-urgent requests or questions.  Due to long hold times on the telephone, sending your provider a message by [facility] may be a faster and more efficient way to get a response.  Please allow 48 business hours for a response.  Please remember that this is for non-urgent requests.  ?_______________________________________________________ ? ?

## 2021-06-17 NOTE — Progress Notes (Signed)
? ? ? ?06/17/2021 ?Abigail Rodgers ?[id number] ?[birth year] ? ? ?HISTORY OF PRESENT ILLNESS: This is a 31 year old female who was seen by me back in November 2020 at which time she was seen for complaints of bright red rectal bleeding.  Her care was assigned to Dr. Myrtie Neither at that time.  She had a bleeding internal hemorrhoid seen on exam and was prescribed hydrocortisone suppositories.  We also discussed banding.  She is here today to discuss what she thinks is likely IBS type symptoms.  She tells me that she actually never used the hydrocortisone suppositories.  She still sees intermittent bright red blood per rectum.  She tells me that she has had ongoing GI symptoms for years.  She says that when she was probably 31 years old she had 3 endoscopies and was told that she had a hiatal hernia and gastritis.  Took a PPI for a while.  Was checked for celiac disease at that time and says that it was negative.  She does not have any upper GI complaints at this time.  She tells me that she can actually eat bread with no GI symptoms so she does believe that she has celiac disease.  Nonetheless, she reports intermittent GI symptoms with alternating bowel habits.  She says that she will alternate between constipation and loose stool/diarrhea.  She says that after the time she ovulates she tends to have constipation and then will flip to diarrhea.  Diarrhea is definitely influenced by trigger foods.  She has noticed that garlic, onions, alcohol sugars, dairy, watermelon, popcorn, etc. tend to cause her a lot of GI symptoms, diarrhea and abdominal cramping.  Abdominal cramping is relieved by having a BM.  She does most of her own cooking at home.  She says that she had taken sertraline for her anxiety in the past and that seemed to help her GI symptoms as well.  She came off of it for a while and then restarted again, but has not seemed to have any improvement in her GI symptoms this second time around.  She says that she has a cousin  with ulcerative colitis and a friend with Crohn's disease.  Otherwise no family history of any GI illness.  She says that she does not feel like she has Crohn's disease or ulcerative colitis as her symptoms seem a lot milder and different than theirs.  Hers is definitely intermittent.  She said that this whole week she has been doing really well, but last week was a bad week.  She says that she is really sensitive to medications.  She tried Bentyl in the past and it just made her feel really bad. ? ? ?Past Medical History:  ?Diagnosis Date  ? Anxiety   ? Asthma   ? Depression   ? GERD (gastroesophageal reflux disease)   ? ?Past Surgical History:  ?Procedure Laterality Date  ? BREAST FIBROADENOMA SURGERY  2015  ? WISDOM TOOTH EXTRACTION    ? ? reports that she has never smoked. She has never used smokeless tobacco. She reports that she does not currently use alcohol. She reports that she does not use drugs. ?family history includes Colon polyps in her father and mother; Dementia in her maternal grandmother; Hyperlipidemia in her brother, maternal aunt, maternal grandmother, and maternal uncle; Hypertension in her mother; Irritable bowel syndrome in her maternal grandmother; Prostate cancer in her paternal grandfather. ?Allergies  ?Allergen Reactions  ? Penicillins Rash  ? Lactose Intolerance (Gi) Other (See Comments)  ? ? ?  ?  Outpatient Encounter Medications as of 06/17/2021  ?Medication Sig  ? LORazepam (ATIVAN) 0.5 MG tablet Take 0.5-1 tablets (0.25-0.5 mg total) by mouth every 8 (eight) hours as needed for anxiety.  ? sertraline (ZOLOFT) 25 MG tablet Take 1.5 tablets (37.5 mg total) by mouth daily.  ? [DISCONTINUED] hydrocortisone (ANUSOL-HC) 25 MG suppository Place 1 suppository (25 mg total) rectally at bedtime. Use for 10 days. (Patient not taking: Reported on 03/25/2019)  ? ?No facility-administered encounter medications on file as of 06/17/2021.  ? ? ? ?REVIEW OF SYSTEMS  : All other systems reviewed and negative  except where noted in the History of Present Illness. ? ? ?PHYSICAL EXAM: ?BP 120/68   Pulse 82   Ht 5\' 5"  (1.651 m)   Wt 116 lb (52.6 kg)   BMI 19.30 kg/m?  ?General: Well developed white female in no acute distress ?Head: Normocephalic and atraumatic ?Eyes:  Sclerae anicteric, conjunctiva pink. ?Ears: Normal auditory acuity ?Lungs: Clear throughout to auscultation; no W/R/R. ?Heart: Regular rate and rhythm; no M/R/G. ?Abdomen: Soft, non-distended.  BS present.  Non-tender. ?Musculoskeletal: Symmetrical with no gross deformities  ?Skin: No lesions on visible extremities ?Extremities: No edema  ?Neurological: Alert oriented x 4, grossly non-focal ?Psychological:  Alert and cooperative. Normal mood and affect ? ?ASSESSMENT AND PLAN: ?*31 year old female with alternating bowel habits and intermittent generalized abdominal pain likely triggered by certain foods.  Has underlying anxiety.  Symptoms suspicious for IBS and she believes that is her issue as well.  Has had longstanding GI symptoms.  3 EGDs for upper GI symptoms remotely.  She reports celiac testing negative at that time.  No upper GI symptoms as of late.  Does not want colonoscopy unless absolutely necessary/last resort.  We discussed FODMAP diet and she was given literature on this.  We will check a CBC, CMP, TSH, sed rate, CRP.  She will try Benefiber 2 teaspoons mixed in 8 ounces of liquid daily. ? ? ?CC:  34, * ? ?  ?

## 2021-06-17 NOTE — Progress Notes (Signed)
____________________________________________________________ ? ?Attending physician addendum: ? ?Thank you for sending this case to me. ?I have reviewed the entire note and agree with the plan. ? ?Sounds like some maldigestion/food intolerances aggravating these IBS-like symptoms as well. ? ?Wilfrid Lund, MD ? ?____________________________________________________________ ? ?

## 2021-07-05 ENCOUNTER — Ambulatory Visit: Payer: BC Managed Care – PPO | Admitting: Physician Assistant

## 2021-07-05 ENCOUNTER — Encounter: Payer: Self-pay | Admitting: Physician Assistant

## 2021-07-05 ENCOUNTER — Other Ambulatory Visit: Payer: Self-pay

## 2021-07-05 DIAGNOSIS — F422 Mixed obsessional thoughts and acts: Secondary | ICD-10-CM

## 2021-07-05 DIAGNOSIS — F411 Generalized anxiety disorder: Secondary | ICD-10-CM

## 2021-07-05 NOTE — Progress Notes (Signed)
[facility] ? ?Patient ID: Abigail Rodgers,  ?MRN: [id number] ? ?PCP: Glenis Smoker, MD ? ?Date of Evaluation: 07/05/2021 ?Time spent:20 minutes ? ?Chief Complaint:  ?Chief Complaint   ?Anxiety ?  ? ? ? ?HISTORY/CURRENT STATUS: ?HPI For routine med check. ? ?Was seen 1 month ago and we discussed changing the timing of her taking the Zoloft.  She was going to split into taking 25 mg in the morning and 12.5 in the evenings, but she ended up forgetting the evening dose.  She has only been taking the 25 mg every morning and states she has been feeling really good for almost a whole month.  This is the longest she has ever gone feeling this good on a daily basis.  Anxiety is nothing like it used to be and obsessions are not a problem right now. ? ?She is enjoying things.  Energy and motivation are good.  Not crying easily.  ADLs and personal hygiene are normal.  She has made some changes in her life including getting off social media and is now no longer watching TV during the week when she gets off work.  She is more able to do things that make her life easier throughout the day, such as cleaning the kitchen before she goes to bed.  That makes her next day even better.  She is still a homebody but she does get out and do things a little more often now and feels more comfortable doing them.  No suicidal or homicidal thoughts reported. ? ?Denies dizziness, syncope, seizures, numbness, tingling, tremor, tics, unsteady gait, slurred speech, confusion. Denies muscle or joint pain, stiffness, or dystonia. ? ?Individual Medical History/ Review of Systems: Changes? :Yes   she saw Alonza Bogus, PA-C, gastroenterology on 06/17/2021.  Confirmed IBS. ? ?Past medications for mental health diagnoses include: ?Zoloft, Ativan ? ?Allergies: Penicillins and Lactose intolerance (gi) ? ?Current Medications:  ?Current Outpatient Medications:  ?  LORazepam (ATIVAN) 0.5 MG tablet, Take 0.5-1 tablets (0.25-0.5 mg total) by mouth  every 8 (eight) hours as needed for anxiety., Disp: 30 tablet, Rfl: 1 ?  sertraline (ZOLOFT) 25 MG tablet, Take 1.5 tablets (37.5 mg total) by mouth daily. (Patient taking differently: Take 25 mg by mouth daily.), Disp: 135 tablet, Rfl: 0 ?Medication Side Effects: none ? ?Family Medical/ Social History: Changes? No ? ?MENTAL HEALTH EXAM: ? ?There were no vitals taken for this visit.There is no height or weight on file to calculate BMI.  ?General Appearance: Casual, Neat and Well Groomed  ?Eye Contact:  Good  ?Speech:  Clear and Coherent and Normal Rate  ?Volume:  Normal  ?Mood:  Euthymic  ?Affect:  Congruent  ?Thought Process:  Goal Directed and Descriptions of Associations: Circumstantial  ?Orientation:  Full (Time, Place, and Person)  ?Thought Content: Logical   ?Suicidal Thoughts:  No  ?Homicidal Thoughts:  No  ?Memory:  WNL  ?Judgement:  Good  ?Insight:  Good  ?Psychomotor Activity:  Normal  ?Concentration:  Concentration: Good  ?Recall:  Good  ?Fund of Knowledge: Good  ?Language: Good  ?Assets:  Desire for Improvement  ?ADL's:  Intact  ?Cognition: WNL  ?Prognosis:  Good  ? ?Labs 06/17/2021 were reviewed.   ? ?DIAGNOSES:  ?  ICD-10-CM   ?1. Generalized anxiety disorder  F41.1   ?  ?2. Mixed obsessional thoughts and acts  F42.2   ?  ? ? ? ?Receiving Psychotherapy: Yes Krystal Eaton at [facility]. ? ? ?RECOMMENDATIONS:  ?PDMP was reviewed.  Last Ativan filled 01/25/2021. ?I provided 20 minutes of face to face time during this encounter, including time spent before and after the visit in records review, medical decision making, counseling pertinent to today's visit, and charting.  ?I am glad to hear that she is doing better!  Will leave the Zoloft at 25 mg and if needed in the future we can increase the dose. ?Congratulations on making lifestyle changes that are beneficial for her overall wellbeing both mentally and physically. ? ?Continue Zoloft 25 mg daily.   ?Continue Ativan 0.5 mg, 1/2-1 p.o. 3 times  daily as needed anxiety.  (She hardly ever takes) I have encouraged her to take it more often to help as a rescue. ?Continue therapy. ?Return in 4 months. ? ?Donnal Moat, PA-C  ?

## 2021-07-29 DIAGNOSIS — F422 Mixed obsessional thoughts and acts: Secondary | ICD-10-CM | POA: Diagnosis not present

## 2021-08-30 DIAGNOSIS — F411 Generalized anxiety disorder: Secondary | ICD-10-CM | POA: Diagnosis not present

## 2021-09-16 DIAGNOSIS — D225 Melanocytic nevi of trunk: Secondary | ICD-10-CM | POA: Diagnosis not present

## 2021-09-16 DIAGNOSIS — Z1283 Encounter for screening for malignant neoplasm of skin: Secondary | ICD-10-CM | POA: Diagnosis not present

## 2021-09-16 DIAGNOSIS — L720 Epidermal cyst: Secondary | ICD-10-CM | POA: Diagnosis not present

## 2021-10-04 DIAGNOSIS — F422 Mixed obsessional thoughts and acts: Secondary | ICD-10-CM | POA: Diagnosis not present

## 2021-10-11 DIAGNOSIS — Z1159 Encounter for screening for other viral diseases: Secondary | ICD-10-CM | POA: Diagnosis not present

## 2021-10-11 DIAGNOSIS — Z79899 Other long term (current) drug therapy: Secondary | ICD-10-CM | POA: Diagnosis not present

## 2021-10-11 DIAGNOSIS — Z0000 Encounter for general adult medical examination without abnormal findings: Secondary | ICD-10-CM | POA: Diagnosis not present

## 2021-10-11 DIAGNOSIS — Z113 Encounter for screening for infections with a predominantly sexual mode of transmission: Secondary | ICD-10-CM | POA: Diagnosis not present

## 2021-10-25 ENCOUNTER — Encounter: Payer: Self-pay | Admitting: Physician Assistant

## 2021-10-25 ENCOUNTER — Ambulatory Visit: Payer: BC Managed Care – PPO | Admitting: Physician Assistant

## 2021-10-25 DIAGNOSIS — F411 Generalized anxiety disorder: Secondary | ICD-10-CM

## 2021-10-25 DIAGNOSIS — F422 Mixed obsessional thoughts and acts: Secondary | ICD-10-CM

## 2021-10-25 DIAGNOSIS — F410 Panic disorder [episodic paroxysmal anxiety] without agoraphobia: Secondary | ICD-10-CM

## 2021-10-25 MED ORDER — LORAZEPAM 0.5 MG PO TABS: 0.2500 mg | ORAL_TABLET | Freq: Three times a day (TID) | ORAL | 1 refills | Status: DC | PRN

## 2021-10-25 MED ORDER — SERTRALINE HCL 25 MG PO TABS
25.0000 mg | ORAL_TABLET | Freq: Every day | ORAL | 1 refills | Status: DC
Start: 2021-10-25 — End: 2022-04-28

## 2021-10-25 NOTE — Progress Notes (Signed)
[facility]  Patient ID: Abigail Rodgers,  MRN: [id number]  PCP: Shon Hale, MD  Date of Evaluation: 10/25/2021 Time spent:20 minutes  Chief Complaint:  Chief Complaint   Anxiety     HISTORY/CURRENT STATUS: HPI For routine med check.  Abigail Rodgers is doing really well.  OCD symptoms are well controlled on low-dose Zoloft.  She recently went to [place], had to take the Ativan a few times which was very helpful.  It helped make her trip much more enjoyable.  She still rarely takes the Ativan but does in situations like this, because panic attacks are triggered with flying.  Patient denies loss of interest in usual activities and is able to enjoy things.  Denies decreased energy.  Denies decreased motivation.  Work is going well.  ADLs and personal hygiene are normal.  Appetite has not changed.  Weight is stable.   No extreme sadness, tearfulness, or feelings of hopelessness.  Sleeps well most of the time.  Denies any changes in concentration, making decisions or remembering things.  Denies suicidal or homicidal thoughts.  Denies dizziness, syncope, seizures, numbness, tingling, tremor, tics, unsteady gait, slurred speech, confusion. Denies muscle or joint pain, stiffness, or dystonia.  Individual Medical History/ Review of Systems: Changes? :No     Past medications for mental health diagnoses include: Zoloft, Ativan  Allergies: Penicillins and Lactose intolerance (gi)  Current Medications:  Current Outpatient Medications:    LORazepam (ATIVAN) 0.5 MG tablet, Take 0.5-1 tablets (0.25-0.5 mg total) by mouth every 8 (eight) hours as needed for anxiety., Disp: 30 tablet, Rfl: 1   sertraline (ZOLOFT) 25 MG tablet, Take 1 tablet (25 mg total) by mouth daily., Disp: 90 tablet, Rfl: 1 Medication Side Effects: none  Family Medical/ Social History: Changes? No  MENTAL HEALTH EXAM:  There were no vitals taken for this visit.There is no height or weight on file to  calculate BMI.  General Appearance: Casual, Neat and Well Groomed  Eye Contact:  Good  Speech:  Clear and Coherent and Normal Rate  Volume:  Normal  Mood:  Euthymic  Affect:  Congruent  Thought Process:  Goal Directed and Descriptions of Associations: Circumstantial  Orientation:  Full (Time, Place, and Person)  Thought Content: Logical   Suicidal Thoughts:  No  Homicidal Thoughts:  No  Memory:  WNL  Judgement:  Good  Insight:  Good  Psychomotor Activity:  Normal  Concentration:  Concentration: Good  Recall:  Good  Fund of Knowledge: Good  Language: Good  Assets:  Desire for Improvement Financial Resources/Insurance Housing Transportation Vocational/Educational  ADL's:  Intact  Cognition: WNL  Prognosis:  Good   Had physical a few weeks ago through [facility], Labs reviewed, under Care Everywhere.  DIAGNOSES:    ICD-10-CM   1. Mixed obsessional thoughts and acts  F42.2     2. Generalized anxiety disorder  F41.1     3. Panic attacks  F41.0       Receiving Psychotherapy: Yes Eugene Naughton at [facility].  RECOMMENDATIONS:  PDMP was reviewed.  Last Ativan filled 01/25/2021. I provided 20 minutes of face to face time during this encounter, including time spent before and after the visit in records review, medical decision making, counseling pertinent to today's visit, and charting.   I am glad to see her doing so well!  And glad that she is willing to take the Ativan when needed.  No changes are necessary.  Continue Zoloft 25 mg daily.   Continue  Ativan 0.5 mg, 1/2-1 p.o. 3 times daily as needed anxiety.   Return in 6 months.  Melony Overly, PA-C

## 2021-12-13 DIAGNOSIS — F411 Generalized anxiety disorder: Secondary | ICD-10-CM | POA: Diagnosis not present

## 2022-01-06 DIAGNOSIS — F411 Generalized anxiety disorder: Secondary | ICD-10-CM | POA: Diagnosis not present

## 2022-01-31 DIAGNOSIS — F422 Mixed obsessional thoughts and acts: Secondary | ICD-10-CM | POA: Diagnosis not present

## 2022-03-14 DIAGNOSIS — F422 Mixed obsessional thoughts and acts: Secondary | ICD-10-CM | POA: Diagnosis not present

## 2022-04-02 DIAGNOSIS — F422 Mixed obsessional thoughts and acts: Secondary | ICD-10-CM | POA: Diagnosis not present

## 2022-04-05 IMAGING — US US  BREAST BX W/ LOC DEV 1ST LESION IMG BX SPEC US GUIDE*R*
1 series · 10 of 10 positions shown · non-contrast
Comparison: Previous exam(s).
COMPARISON: Previous exam(s).

Addendum:
CLINICAL DATA: 29-year-old female presenting for ultrasound-guided
biopsy of a right breast mass.

EXAM:
ULTRASOUND GUIDED RIGHT BREAST CORE NEEDLE BIOPSY

[Series 1: us breast bx w/ loc dev 1st lesion img bx spec us  · 0.06mm/px · 10 of 10 slices shown]
[im 1/10]
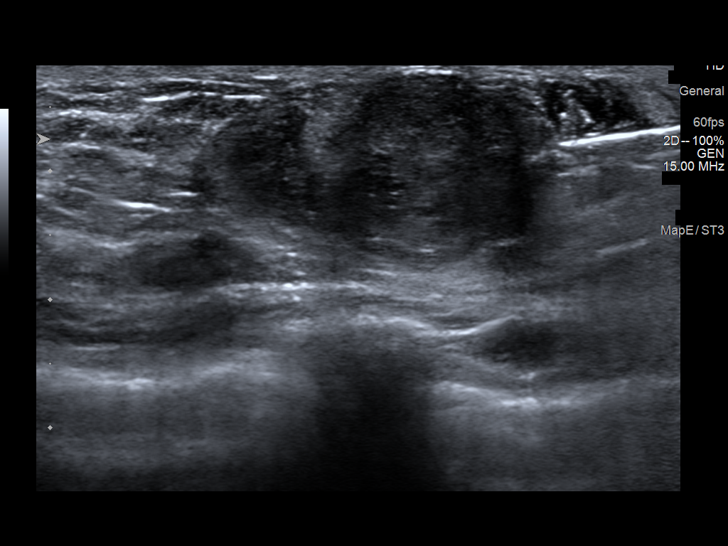
[im 2/10]
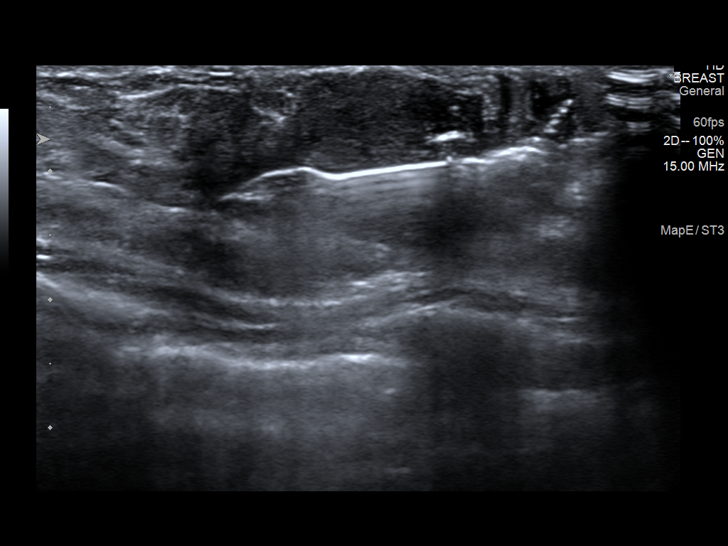
[im 3/10]
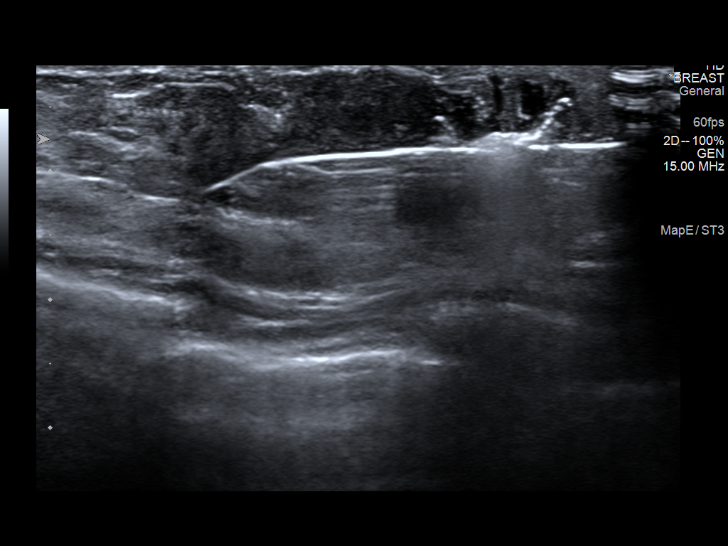
[im 4/10]
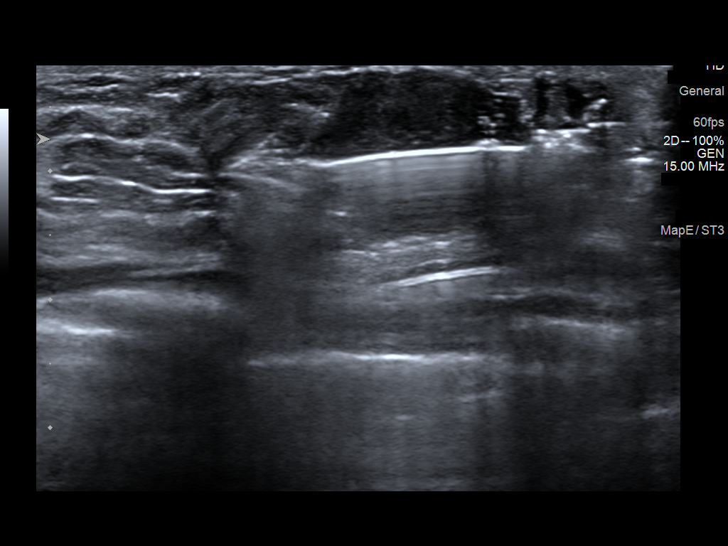
[im 5/10]
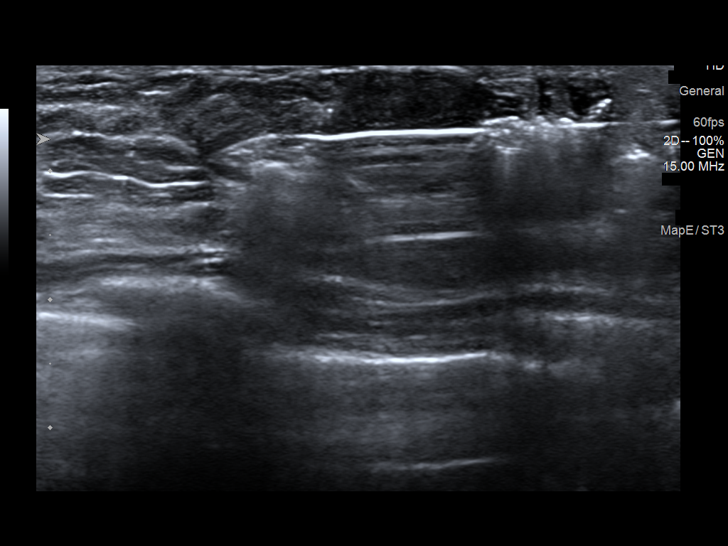
[im 6/10]
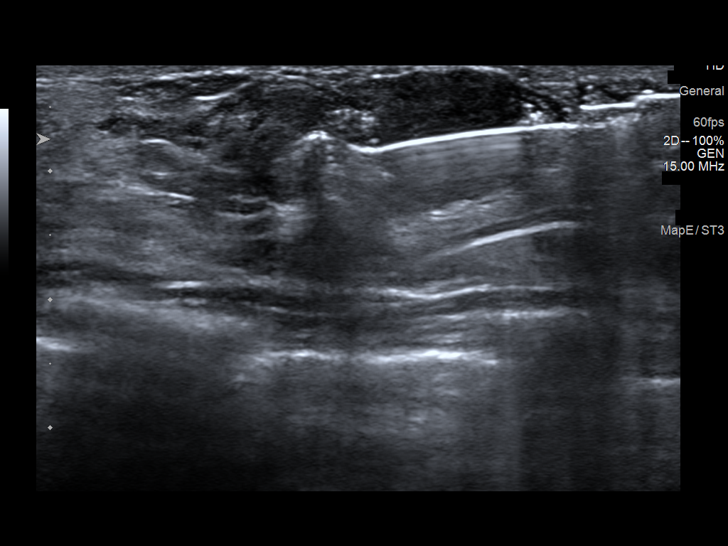
[im 7/10]
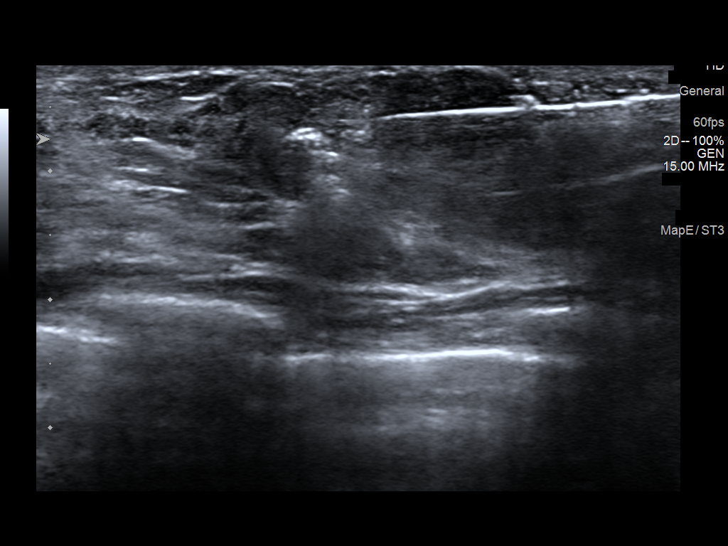
[im 8/10]
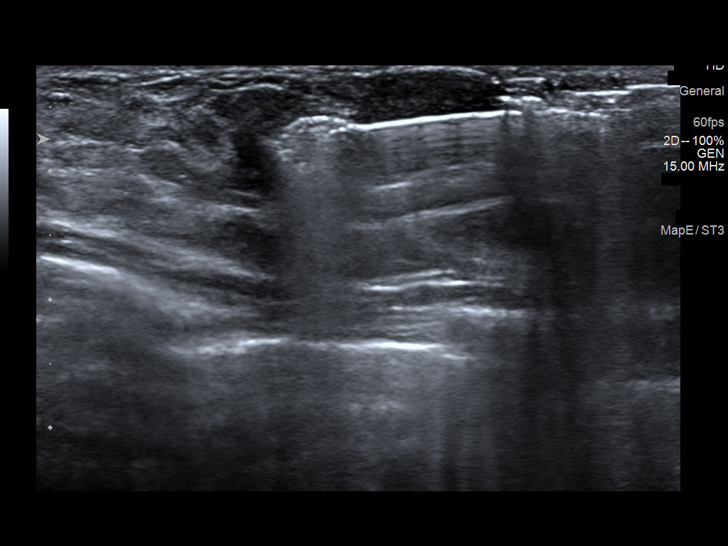
[im 9/10]
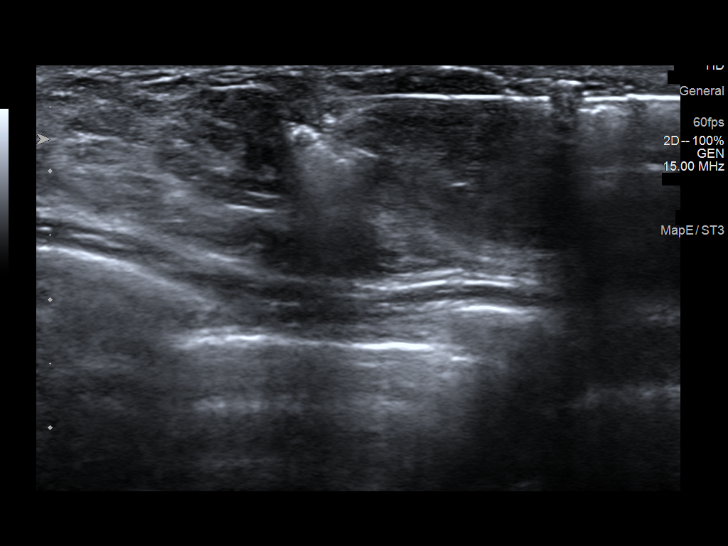
[im 10/10]
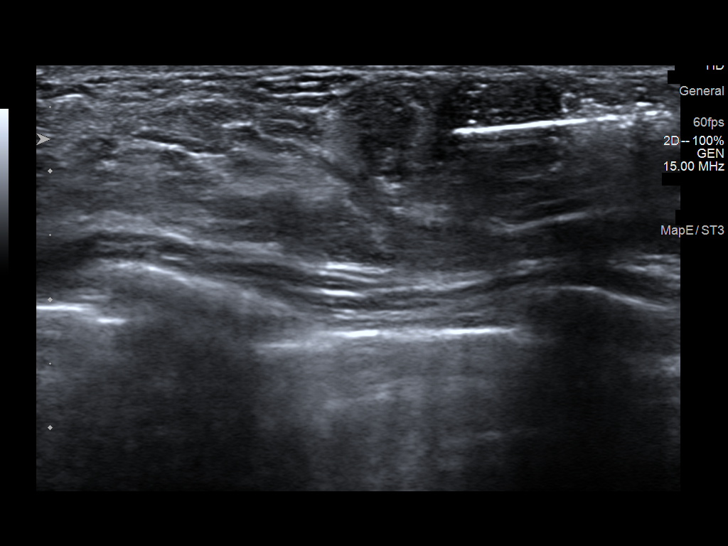

[10 of 10 positions shown; findings below may reference images not displayed]



Lesion quadrant: Lower inner quadrant

Using sterile technique and 1% Lidocaine as local anesthetic, under
direct ultrasound visualization, a 14 gauge Billiot device was
used to perform biopsy of a mass in the right breast at 6 o'clock, 4
cm from the nipple using an inferior approach. At the conclusion of
the procedure a tribell tissue marker clip was deployed into the
biopsy cavity.
IMPRESSION: Ultrasound guided biopsy of a right breast mass at 6 o'clock. No
apparent complications.

ADDENDUM:
Pathology revealed FIBROADENOMA of the Right breast, 6 o'clock,
7cmfn (tribell clip). This was found to be concordant by Dr.
Katia Moton.

Pathology results were discussed with the patient by telephone. The
patient reported doing well after the biopsy with tenderness at the
site. Post biopsy instructions and care were reviewed and questions
were answered. The patient was encouraged to call The [REDACTED] for any additional concerns. My direct phone
number was provided.

The patient was instructed to continue with monthly self breast
examinations, clinical follow-up as needed, and to return for annual
mammography at 38, unless instructed otherwise.

Pathology results reported by Takia Vivanco, RN on 10/29/2020.



Lesion quadrant: Lower inner quadrant

Using sterile technique and 1% Lidocaine as local anesthetic, under
direct ultrasound visualization, a 14 gauge Billiot device was
used to perform biopsy of a mass in the right breast at 6 o'clock, 4
cm from the nipple using an inferior approach. At the conclusion of
the procedure a tribell tissue marker clip was deployed into the
biopsy cavity.
IMPRESSION: Ultrasound guided biopsy of a right breast mass at 6 o'clock. No
apparent complications.

## 2022-04-27 ENCOUNTER — Other Ambulatory Visit: Payer: Self-pay | Admitting: Physician Assistant

## 2022-05-02 ENCOUNTER — Ambulatory Visit: Payer: BC Managed Care – PPO | Admitting: Physician Assistant

## 2022-05-02 ENCOUNTER — Encounter: Payer: Self-pay | Admitting: Physician Assistant

## 2022-05-02 DIAGNOSIS — F411 Generalized anxiety disorder: Secondary | ICD-10-CM | POA: Diagnosis not present

## 2022-05-02 DIAGNOSIS — F422 Mixed obsessional thoughts and acts: Secondary | ICD-10-CM | POA: Diagnosis not present

## 2022-05-02 NOTE — Progress Notes (Signed)
[facility]  Patient ID: Abigail Rodgers,  MRN: [id number]  PCP: Glenis Smoker, MD  Date of Evaluation: 05/02/2022 Time spent:20 minutes  Chief Complaint:  Chief Complaint   Anxiety; Follow-up     HISTORY/CURRENT STATUS: HPI For routine med check.  Doing well. Eventually wants to get off meds, even though she's on very low dose Zoloft. It has helped a ton though, not obsessing about things like she did prior to taking it. Denies PA, just gets overwhelmed easily. Occasionally takes Ativan and it helpful when needed.  Patient is able to enjoy things.  Energy and motivation are good.  Work is going well, works in [employer].  No extreme sadness, tearfulness, or feelings of hopelessness.  Sleeps well most of the time. ADLs and personal hygiene are normal.   Denies any changes in concentration, making decisions, or remembering things.  Appetite has not changed.  Weight is stable.  Denies laxative use, calorie restricting, or binging and purging.   Denies cutting or any form of self-harm.  Denies suicidal or homicidal thoughts.  Denies dizziness, syncope, seizures, numbness, tingling, tremor, tics, unsteady gait, slurred speech, confusion. Denies muscle or joint pain, stiffness, or dystonia.  Individual Medical History/ Review of Systems: Changes? :No     Past medications for mental health diagnoses include: Zoloft, Ativan  Allergies: Penicillins and Lactose intolerance (gi)  Current Medications:  Current Outpatient Medications:    LORazepam (ATIVAN) 0.5 MG tablet, Take 0.5-1 tablets (0.25-0.5 mg total) by mouth every 8 (eight) hours as needed for anxiety., Disp: 30 tablet, Rfl: 1   sertraline (ZOLOFT) 25 MG tablet, TAKE 1 TABLET (25 MG TOTAL) BY MOUTH DAILY., Disp: 90 tablet, Rfl: 0 Medication Side Effects: none  Family Medical/ Social History: Changes? No  MENTAL HEALTH EXAM:  There were no vitals taken for this visit.There is no height or weight on file  to calculate BMI.  General Appearance: Casual, Neat and Well Groomed  Eye Contact:  Good  Speech:  Clear and Coherent and Normal Rate  Volume:  Normal  Mood:  Euthymic  Affect:  Congruent  Thought Process:  Goal Directed and Descriptions of Associations: Circumstantial  Orientation:  Full (Time, Place, and Person)  Thought Content: Logical   Suicidal Thoughts:  No  Homicidal Thoughts:  No  Memory:  WNL  Judgement:  Good  Insight:  Good  Psychomotor Activity:  Normal  Concentration:  Concentration: Good  Recall:  Good  Fund of Knowledge: Good  Language: Good  Assets:  Desire for Improvement Financial Resources/Insurance Housing Physical Health Resilience Transportation Vocational/Educational  ADL's:  Intact  Cognition: WNL  Prognosis:  Good   DIAGNOSES:    ICD-10-CM   1. Mixed obsessional thoughts and acts  F42.2     2. Generalized anxiety disorder  F41.1       Receiving Psychotherapy: Yes Eugene Naughton at [facility].  RECOMMENDATIONS:  PDMP was reviewed.  Last Ativan filled 11/01/2021. I provided 20 minutes of face to face time during this encounter, including time spent before and after the visit in records review, medical decision making, counseling pertinent to today's visit, and charting.   Discussed the Zoloft. Reminded her she's on a very low dose already, the OCD is well controlled. I recommend she stay on current dose, but if she really wants to, ok to decrease to 1/2 pill for a wk and then stop. She doesn't want to change right now.   Continue Zoloft 25 mg daily.  Continue Ativan  0.5 mg, 1/2-1 p.o. 3 times daily as needed anxiety.   Continue therapy.  Return in 6 months.  Donnal Moat, PA-C

## 2022-05-30 DIAGNOSIS — F422 Mixed obsessional thoughts and acts: Secondary | ICD-10-CM | POA: Diagnosis not present

## 2022-06-27 DIAGNOSIS — F422 Mixed obsessional thoughts and acts: Secondary | ICD-10-CM | POA: Diagnosis not present

## 2022-07-03 DIAGNOSIS — Z124 Encounter for screening for malignant neoplasm of cervix: Secondary | ICD-10-CM | POA: Diagnosis not present

## 2022-07-03 DIAGNOSIS — Z01419 Encounter for gynecological examination (general) (routine) without abnormal findings: Secondary | ICD-10-CM | POA: Diagnosis not present

## 2022-07-10 ENCOUNTER — Other Ambulatory Visit: Payer: Self-pay | Admitting: Physician Assistant

## 2022-07-10 NOTE — Telephone Encounter (Signed)
Pt LVM. Traveling tonight and need 1 pill to get through trip. [facility] [address] Contact # [phone]

## 2022-07-23 ENCOUNTER — Other Ambulatory Visit: Payer: Self-pay | Admitting: Physician Assistant

## 2022-07-25 DIAGNOSIS — F422 Mixed obsessional thoughts and acts: Secondary | ICD-10-CM | POA: Diagnosis not present

## 2022-07-29 DIAGNOSIS — R195 Other fecal abnormalities: Secondary | ICD-10-CM | POA: Diagnosis not present

## 2022-08-14 DIAGNOSIS — R109 Unspecified abdominal pain: Secondary | ICD-10-CM | POA: Diagnosis not present

## 2022-08-14 DIAGNOSIS — R5383 Other fatigue: Secondary | ICD-10-CM | POA: Diagnosis not present

## 2022-08-20 DIAGNOSIS — T691XXA Chilblains, initial encounter: Secondary | ICD-10-CM | POA: Diagnosis not present

## 2022-08-20 DIAGNOSIS — L308 Other specified dermatitis: Secondary | ICD-10-CM | POA: Diagnosis not present

## 2022-08-20 DIAGNOSIS — Z1283 Encounter for screening for malignant neoplasm of skin: Secondary | ICD-10-CM | POA: Diagnosis not present

## 2022-08-20 DIAGNOSIS — D225 Melanocytic nevi of trunk: Secondary | ICD-10-CM | POA: Diagnosis not present

## 2022-09-23 ENCOUNTER — Encounter: Payer: Self-pay | Admitting: Gastroenterology

## 2022-09-23 ENCOUNTER — Ambulatory Visit: Payer: BC Managed Care – PPO | Admitting: Gastroenterology

## 2022-09-23 VITALS — BP 128/82 | HR 90 | Ht 64.0 in | Wt 112.1 lb

## 2022-09-23 DIAGNOSIS — R140 Abdominal distension (gaseous): Secondary | ICD-10-CM | POA: Diagnosis not present

## 2022-09-23 DIAGNOSIS — R197 Diarrhea, unspecified: Secondary | ICD-10-CM | POA: Diagnosis not present

## 2022-09-23 NOTE — Patient Instructions (Signed)
You have been given a testing kit to check for small intestine bacterial overgrowth (SIBO) which is completed by a company named [facility]. Make sure to return your test in the mail using the return mailing label given to you along with the kit. The test order, your demographic and insurance information have all already been sent to the company. [facility] will collect an upfront charge of $99.74 for commercial insurance plans and $209.74 is you are paying cash. Make sure to discuss with [facility] PRIOR to having the test to see if they have gotten information from your insurance company as to how much your testing will cost out of pocket, if any. Please contact [facility] at phone number [phone] to get instructions regarding how to perform the test as our office is unable to give specific testing instructions.  _______________________________________________________  If your blood pressure at your visit was 140/90 or greater, please contact your primary care physician to follow up on this.  _______________________________________________________  If you are age 3 or older, your body mass index should be between 23-30. Your Body mass index is 19.25 kg/m. If this is out of the aforementioned range listed, please consider follow up with your Primary Care Provider.  If you are age 34 or younger, your body mass index should be between 19-25. Your Body mass index is 19.25 kg/m. If this is out of the aformentioned range listed, please consider follow up with your Primary Care Provider.   ________________________________________________________  The [place] [facility] providers would like to encourage you to use [facility] to communicate with providers for non-urgent requests or questions.  Due to long hold times on the telephone, sending your provider a message by [facility] may be a faster and more efficient way to get a response.  Please allow 48 business hours for a response.  Please  remember that this is for non-urgent requests.  _______________________________________________________

## 2022-09-23 NOTE — Progress Notes (Signed)
____________________________________________________________  Attending physician addendum:  Thank you for sending this case to me. I have reviewed the entire note and agree with the plan.  SIBO testing reasonable, though she does not have apparent risk factors for that.  If that is negative, then this would seem to be IBS with perhaps some maldigestion triggers, and she can certainly manage it without medicines if she prefers.  Amada Jupiter, MD  ____________________________________________________________

## 2022-09-23 NOTE — Progress Notes (Signed)
09/23/2022 Shreshta Visaya [id number] [birth year]   HISTORY OF PRESENT ILLNESS: This is a 32 year old female who is a patient Dr. Irving Burton.  She was last seen by me in March 2023.  She has what is suspected to be IBS.  She has ongoing GI symptoms for years and says that she had 3 endoscopies probably around the time that she was 32 years old and was told she had a hiatal hernia and gastritis.  Took PPI for a while.  Was checked for celiac disease at that time and says that was negative.  No longer has any upper GI complaints.  More so as of late she has been suffering with altered bowel habits, mostly loose stools/diarrhea and generalized abdominal pain and cramping.  She believes that there are definitely trigger foods for her and tries to completely avoid those.  Those things include garlic, onions, alcohol sugars, dairy, watermelon, popcorn, etc.  She had been doing well, but then started with symptoms again for 6 days in a row was having diarrhea after she would eat.  She was also complaining of burning lower abdominal pain.  She did cut out gluten and says that she has felt much better since doing so.  Says this has helped significantly including with the gas and bloating.  She does tell me that she tapered off of her sertraline and all of this flared up sometime after coming off of that.  She did not like the way that the medications made her feel.  She also tried dicyclomine for abdominal pain in the past, but says that she did not like the side effects from that either.   Past Medical History:  Diagnosis Date   Anxiety    Asthma    Depression    GERD (gastroesophageal reflux disease)    Past Surgical History:  Procedure Laterality Date   BREAST FIBROADENOMA SURGERY  2015   ESOPHAGOGASTRODUODENOSCOPY     x3 times over 10 years   WISDOM TOOTH EXTRACTION      reports that she has never smoked. She has never used smokeless tobacco. She reports that she does not currently use alcohol. She  reports that she does not use drugs. family history includes Colon polyps in her father and mother; Dementia in her maternal grandmother; Hyperlipidemia in her brother, maternal aunt, maternal grandmother, and maternal uncle; Hypertension in her mother; Irritable bowel syndrome in her maternal grandmother; Prostate cancer in her paternal grandfather. Allergies  Allergen Reactions   Penicillins Rash   Lactose Intolerance (Gi) Other (See Comments)      Outpatient Encounter Medications as of 09/23/2022  Medication Sig   Lactobacillus (ACIDOPHILUS) 0.5 MG TABS 1 tablet daily.   LORazepam (ATIVAN) 0.5 MG tablet TAKE 1/2 TO 1 TABLET (0.25-0.5 MG TOTAL) BY MOUTH EVERY 8 HOURS AS NEEDED FOR ANXIETY   [DISCONTINUED] sertraline (ZOLOFT) 25 MG tablet TAKE 1 TABLET (25 MG TOTAL) BY MOUTH DAILY.   No facility-administered encounter medications on file as of 09/23/2022.     REVIEW OF SYSTEMS  : All other systems reviewed and negative except where noted in the History of Present Illness.   PHYSICAL EXAM: BP 128/82   Pulse 90   Ht 5\' 4"  (1.626 m)   Wt 112 lb 2 oz (50.9 kg)   BMI 19.25 kg/m  General: Well developed white female in no acute distress Head: Normocephalic and atraumatic Eyes:  Sclerae anicteric, conjunctiva pink. Ears: Normal auditory acuity Lungs: Clear throughout to auscultation; no W/R/R.  Heart: Regular rate and rhythm; no M/R/G. Abdomen: Soft, non-distended.  BS present.  Mild diffuse TTP. Musculoskeletal: Symmetrical with no gross deformities  Skin: No lesions on visible extremities Extremities: No edema  Neurological: Alert oriented x 4, grossly non-focal Psychological:  Alert and cooperative. Normal mood and affect  ASSESSMENT AND PLAN: *32 year old female with alternating bowel habits, but mostly diarrhea issues as of late and intermittent generalized abdominal pain likely triggered by certain foods.  Has underlying anxiety but has come off of her sertraline.  Symptoms  suspicious for IBS and she believes this is her issue as well.  She has had 3 EGDs for upper GI symptoms remotely.  No significant upper GI symptoms as of late.  She has done FODMAP diet and most recently completely cut out gluten as she was still eating that stuff.  Extensive labs were normal.  She does not want colonoscopy unless absolutely necessary, but we did again discuss this.  Will plan for SIBO breath test.  She has that she did that probably about 10 years ago and it was negative at that time.   CC:  Shon Hale, *

## 2022-09-26 DIAGNOSIS — R140 Abdominal distension (gaseous): Secondary | ICD-10-CM | POA: Diagnosis not present

## 2022-10-20 DIAGNOSIS — Z0000 Encounter for general adult medical examination without abnormal findings: Secondary | ICD-10-CM | POA: Diagnosis not present

## 2022-10-24 ENCOUNTER — Encounter: Payer: Self-pay | Admitting: Physician Assistant

## 2022-10-24 ENCOUNTER — Ambulatory Visit: Payer: BC Managed Care – PPO | Admitting: Physician Assistant

## 2022-10-24 DIAGNOSIS — F422 Mixed obsessional thoughts and acts: Secondary | ICD-10-CM | POA: Diagnosis not present

## 2022-10-24 DIAGNOSIS — F410 Panic disorder [episodic paroxysmal anxiety] without agoraphobia: Secondary | ICD-10-CM | POA: Diagnosis not present

## 2022-10-24 NOTE — Progress Notes (Unsigned)
[facility]  Patient ID: Abigail Rodgers,  MRN: [id number]  PCP: Shon Hale, MD  Date of Evaluation: 10/24/2022 Time spent:20 minutes  Chief Complaint:   HISTORY/CURRENT STATUS: HPI For routine med check.  She stopped Zoloft    Denies dizziness, syncope, seizures, numbness, tingling, tremor, tics, unsteady gait, slurred speech, confusion. Denies muscle or joint pain, stiffness, or dystonia.  Individual Medical History/ Review of Systems: Changes? :No     Past medications for mental health diagnoses include: Zoloft, Ativan  Allergies: Penicillins and Lactose intolerance (gi)  Current Medications:  Current Outpatient Medications:    Lactobacillus (ACIDOPHILUS) 0.5 MG TABS, 1 tablet daily., Disp: , Rfl:    LORazepam (ATIVAN) 0.5 MG tablet, TAKE 1/2 TO 1 TABLET (0.25-0.5 MG TOTAL) BY MOUTH EVERY 8 HOURS AS NEEDED FOR ANXIETY, Disp: 30 tablet, Rfl: 0 Medication Side Effects: none  Family Medical/ Social History: Changes? No  MENTAL HEALTH EXAM:  There were no vitals taken for this visit.There is no height or weight on file to calculate BMI.  General Appearance: Casual, Neat and Well Groomed  Eye Contact:  Good  Speech:  Clear and Coherent and Normal Rate  Volume:  Normal  Mood:  Euthymic  Affect:  Congruent  Thought Process:  Goal Directed and Descriptions of Associations: Circumstantial  Orientation:  Full (Time, Place, and Person)  Thought Content: Logical   Suicidal Thoughts:  No  Homicidal Thoughts:  No  Memory:  WNL  Judgement:  Good  Insight:  Good  Psychomotor Activity:  Normal  Concentration:  Concentration: Good  Recall:  Good  Fund of Knowledge: Good  Language: Good  Assets:  Desire for Improvement Financial Resources/Insurance Housing Physical Health Resilience Transportation Vocational/Educational  ADL's:  Intact  Cognition: WNL  Prognosis:  Good   DIAGNOSES:  No diagnosis found.  Receiving Psychotherapy: Yes Reather Laurence at [facility].  RECOMMENDATIONS:  PDMP was reviewed.  Last Ativan filled 07/10/2022.      Continue Ativan 0.5 mg, 1/2-1 p.o. 3 times daily as needed anxiety.   Continue therapy.  Return in 6 months.  Melony Overly, PA-C

## 2022-10-28 ENCOUNTER — Telehealth: Payer: Self-pay | Admitting: Gastroenterology

## 2022-10-28 NOTE — Telephone Encounter (Signed)
Inbound call from patient stating that she took a SIBO test a month ago and has not heard the results. Requesting a call back to discuss. Please advise.

## 2022-10-28 NOTE — Telephone Encounter (Signed)
 The patient has been notified of this information and all questions answered.

## 2022-10-28 NOTE — Telephone Encounter (Signed)
Have you seen any SIBO test results?

## 2022-12-05 DIAGNOSIS — F422 Mixed obsessional thoughts and acts: Secondary | ICD-10-CM | POA: Diagnosis not present

## 2022-12-20 DIAGNOSIS — F422 Mixed obsessional thoughts and acts: Secondary | ICD-10-CM | POA: Diagnosis not present

## 2023-01-09 DIAGNOSIS — F422 Mixed obsessional thoughts and acts: Secondary | ICD-10-CM | POA: Diagnosis not present

## 2023-01-29 DIAGNOSIS — R1032 Left lower quadrant pain: Secondary | ICD-10-CM | POA: Diagnosis not present

## 2023-01-30 DIAGNOSIS — F422 Mixed obsessional thoughts and acts: Secondary | ICD-10-CM | POA: Diagnosis not present

## 2023-02-20 DIAGNOSIS — F422 Mixed obsessional thoughts and acts: Secondary | ICD-10-CM | POA: Diagnosis not present

## 2023-03-03 ENCOUNTER — Encounter: Payer: Self-pay | Admitting: Gastroenterology

## 2023-03-03 ENCOUNTER — Ambulatory Visit: Payer: BC Managed Care – PPO | Admitting: Gastroenterology

## 2023-03-03 VITALS — BP 120/96 | HR 92 | Ht 65.0 in | Wt 112.2 lb

## 2023-03-03 DIAGNOSIS — R1013 Epigastric pain: Secondary | ICD-10-CM | POA: Diagnosis not present

## 2023-03-03 DIAGNOSIS — R110 Nausea: Secondary | ICD-10-CM | POA: Diagnosis not present

## 2023-03-03 DIAGNOSIS — K529 Noninfective gastroenteritis and colitis, unspecified: Secondary | ICD-10-CM | POA: Diagnosis not present

## 2023-03-03 DIAGNOSIS — R140 Abdominal distension (gaseous): Secondary | ICD-10-CM | POA: Diagnosis not present

## 2023-03-03 MED ORDER — METOCLOPRAMIDE HCL 5 MG PO TABS: ORAL_TABLET | ORAL | 0 refills | Status: AC

## 2023-03-03 NOTE — Patient Instructions (Signed)
  You have been scheduled for a colonoscopy/EGD. Please follow written instructions given to you at your visit today.   Please pick up your prep supplies at the pharmacy within the next 1-3 days.  If you use inhalers (even only as needed), please bring them with you on the day of your procedure.  DO NOT TAKE 7 DAYS PRIOR TO TEST- Trulicity (dulaglutide) Ozempic, Wegovy (semaglutide) Mounjaro (tirzepatide) Bydureon Bcise (exanatide extended release)  DO NOT TAKE 1 DAY PRIOR TO YOUR TEST Rybelsus (semaglutide) Adlyxin (lixisenatide) Victoza (liraglutide) Byetta (exanatide) ___________________________________________________________________________  _______________________________________________________  If your blood pressure at your visit was 140/90 or greater, please contact your primary care physician to follow up on this.  _______________________________________________________  If you are age 91 or older, your body mass index should be between 23-30. Your Body mass index is 18.68 kg/m. If this is out of the aforementioned range listed, please consider follow up with your Primary Care Provider.  If you are age 38 or younger, your body mass index should be between 19-25. Your Body mass index is 18.68 kg/m. If this is out of the aformentioned range listed, please consider follow up with your Primary Care Provider.   ________________________________________________________  The [place] [facility] providers would like to encourage you to use [facility] to communicate with providers for non-urgent requests or questions.  Due to long hold times on the telephone, sending your provider a message by [facility] may be a faster and more efficient way to get a response.  Please allow 48 business hours for a response.  Please remember that this is for non-urgent requests.  _______________________________________________________ It was a pleasure to see you today!  Thank you for trusting me with your  gastrointestinal care!

## 2023-03-03 NOTE — Progress Notes (Signed)
[facility] Progress Note  Chief Complaint: Abdominal pain and diarrhea, gas and bloating  Subjective  Prior history  Johnnye was seen here in consultation by our APP in June of this year.  Clinical details in that consult note, but essentially many years of abdominal pain with bloating and diarrhea.  Reports previously negative celiac testing and prior endoscopy at outside GI clinic years ago.  Also reports negative prior celiac testing, though symptoms improved on a reduced or gluten-free diet.  Negative SIBO breath testing after the most recent consult here.   Discussed the use of AI scribe software for clinical note transcription with the patient, who gave verbal consent to proceed.  History of Present Illness   The patient, with a history of gastrointestinal issues, reports a significant improvement in diarrhea after adhering to a low FODMAP diet and maintaining a food journal. However, they continue to struggle with persistent nausea and indigestion, which they describe as their most bothersome symptoms. They have a history of upper gastrointestinal problems, including a small hiatal hernia and gastritis, both identified in previous endoscopies. The last endoscopy was approximately ten years ago.  The patient has attempted various dietary modifications to manage their symptoms, including a gluten-free diet for five months. Although they felt better on this diet, they experienced significant weight loss and menstrual irregularities, which they attribute to insufficient caloric intake. Since reintroducing gluten, their menstrual cycle has normalized, but they have not regained any weight. Despite this, they report feeling more energetic.  The patient has a history of sertraline use, which they discontinued. They believe this cessation has contributed to their current gastrointestinal symptoms. They have not resumed this medication. They have also been tested for celiac disease, both  through blood tests and endoscopic biopsy, all of which were negative. Despite this, they report feeling better on a reduced gluten diet. They suspect they may have irritable bowel syndrome (IBS), but this has not been formally diagnosed.  The patient's symptoms have significantly impacted their quality of life, causing them to struggle with meal planning and maintaining a healthy weight. They express concern about potential overlooked or evolving conditions, such as celiac disease or Crohn's disease. Despite their apprehension about anesthesia, they are open to further endoscopic procedures for a more definitive diagnosis.      Since last visit here, she had about a 12-hour episode of intense left lower quadrant pain even though she was moving her bowels well.  She reports having seen gynecology and had a pelvic ultrasound that was unremarkable.  Denies rectal bleeding.  ROS: Cardiovascular:  no chest pain Respiratory: no dyspnea Chronic anxiety, stable Remainder systems negative except as noted above  The patient's Past Medical, Family and Social History were reviewed and are on file in the EMR. Past Medical History:  Diagnosis Date   Anxiety    Asthma    Depression    GERD (gastroesophageal reflux disease)     Past Surgical History:  Procedure Laterality Date   BREAST FIBROADENOMA SURGERY  2015   ESOPHAGOGASTRODUODENOSCOPY     x3 times over 10 years   WISDOM TOOTH EXTRACTION       Objective:  Med list reviewed  Current Outpatient Medications:    Lactobacillus (ACIDOPHILUS) 0.5 MG TABS, 1 tablet daily., Disp: , Rfl:    LORazepam (ATIVAN) 0.5 MG tablet, TAKE 1/2 TO 1 TABLET (0.25-0.5 MG TOTAL) BY MOUTH EVERY 8 HOURS AS NEEDED FOR ANXIETY, Disp: 30 tablet, Rfl: 0   metoCLOPramide (  REGLAN) 5 MG tablet, Please take 1 tablet by mouth the day before your procedure ( at 5:00pm) and then take 1 tablet by mouth a hour before prep the morning of the procedure., Disp: 2 tablet, Rfl:  0   Vital signs in last 24 hrs: Vitals:   03/03/23 0906 03/03/23 1009  BP: (!) 110/90 (!) 120/96  Pulse: 92    Wt Readings from Last 3 Encounters:  03/03/23 112 lb 4 oz (50.9 kg)  09/23/22 112 lb 2 oz (50.9 kg)  06/17/21 116 lb (52.6 kg)    Physical Exam  Thin, well-appearing, pleasant and conversational HEENT: sclera anicteric, oral mucosa moist without lesions Neck: supple, no thyromegaly, JVD or lymphadenopathy Cardiac: Regular without appreciable murmur,  no peripheral edema Pulm: clear to auscultation bilaterally, normal RR and effort noted Abdomen: soft, no tenderness, with active bowel sounds. No guarding or palpable hepatosplenomegaly. Skin; warm and dry, no jaundice or rash   Labs:   ___________________________________________ Radiologic studies:   ____________________________________________ Other:   _____________________________________________   Encounter Diagnoses  Name Primary?   Chronic diarrhea Yes   Bloating    Epigastric pain    Nausea in adult     Assessment and Plan    Chronic Gastrointestinal Symptoms Longstanding history of nausea, indigestion, and diarrhea. Previous endoscopies reportedly revealed small hiatal hernia and gastritis. Symptoms improved with dietary modifications (low FODMAP, reduced gluten) but persist. Negative for celiac disease in the past (she believes duodenal biopsies were taken on a prior endoscopy), but symptoms improved with gluten reduction. No weight gain despite improved energy levels. -Plan for upper endoscopy and colonoscopy to further investigate chronic symptoms and rule out other potential causes such as Crohn's disease.  Weight Loss Difficulty gaining weight despite dietary modifications and improved energy levels. Menstrual irregularities resolved with increased caloric intake. -Continue dietary modifications and monitor weight.  Anxiety about Anesthesia Expressed concern about undergoing anesthesia for  endoscopic procedures. -Reassured about the safety and efficacy of propofol for endoscopic procedures. Explained the benefits of doing both procedures at the same time.   The benefits and risks of the planned procedure were described in detail with the patient or (when appropriate) their health care proxy.  Risks were outlined as including, but not limited to, bleeding, infection, perforation, adverse medication reaction leading to cardiac or pulmonary decompensation, pancreatitis (if ERCP).  The limitation of incomplete mucosal visualization was also discussed.  No guarantees or warranties were given.   Since she reportedly had worsening of all the symptoms after weaning off sertraline, we will have further discussions after her endoscopic procedures about whether or not she is considering reinstituting that medicine or any other pharmacologic therapy for anxiety.  Antispasmodic medicines have been a little benefit.     Charlie Pitter III

## 2023-03-09 DIAGNOSIS — F419 Anxiety disorder, unspecified: Secondary | ICD-10-CM | POA: Diagnosis not present

## 2023-03-09 DIAGNOSIS — F410 Panic disorder [episodic paroxysmal anxiety] without agoraphobia: Secondary | ICD-10-CM | POA: Diagnosis not present

## 2023-03-09 DIAGNOSIS — R6889 Other general symptoms and signs: Secondary | ICD-10-CM | POA: Diagnosis not present

## 2023-03-11 ENCOUNTER — Other Ambulatory Visit: Payer: Self-pay | Admitting: Medical Genetics

## 2023-03-18 ENCOUNTER — Other Ambulatory Visit: Payer: Self-pay | Admitting: Medical Genetics

## 2023-03-20 DIAGNOSIS — F422 Mixed obsessional thoughts and acts: Secondary | ICD-10-CM | POA: Diagnosis not present

## 2023-04-01 DIAGNOSIS — K581 Irritable bowel syndrome with constipation: Secondary | ICD-10-CM | POA: Diagnosis not present

## 2023-04-01 DIAGNOSIS — E559 Vitamin D deficiency, unspecified: Secondary | ICD-10-CM | POA: Diagnosis not present

## 2023-04-01 DIAGNOSIS — R519 Headache, unspecified: Secondary | ICD-10-CM | POA: Diagnosis not present

## 2023-04-01 DIAGNOSIS — R5383 Other fatigue: Secondary | ICD-10-CM | POA: Diagnosis not present

## 2023-04-01 DIAGNOSIS — F410 Panic disorder [episodic paroxysmal anxiety] without agoraphobia: Secondary | ICD-10-CM | POA: Diagnosis not present

## 2023-04-01 DIAGNOSIS — E538 Deficiency of other specified B group vitamins: Secondary | ICD-10-CM | POA: Diagnosis not present

## 2023-04-01 DIAGNOSIS — E611 Iron deficiency: Secondary | ICD-10-CM | POA: Diagnosis not present

## 2023-04-01 DIAGNOSIS — F419 Anxiety disorder, unspecified: Secondary | ICD-10-CM | POA: Diagnosis not present

## 2023-04-01 DIAGNOSIS — R140 Abdominal distension (gaseous): Secondary | ICD-10-CM | POA: Diagnosis not present

## 2023-04-01 DIAGNOSIS — Z131 Encounter for screening for diabetes mellitus: Secondary | ICD-10-CM | POA: Diagnosis not present

## 2023-04-03 DIAGNOSIS — F422 Mixed obsessional thoughts and acts: Secondary | ICD-10-CM | POA: Diagnosis not present

## 2023-04-13 ENCOUNTER — Other Ambulatory Visit (HOSPITAL_COMMUNITY): Payer: BC Managed Care – PPO

## 2023-04-14 ENCOUNTER — Other Ambulatory Visit (HOSPITAL_COMMUNITY)
Admission: RE | Admit: 2023-04-14 | Discharge: 2023-04-14 | Disposition: A | Discharge status: Home / Self Care | Payer: Self-pay | Source: Ambulatory Visit | Attending: Medical Genetics | Admitting: Medical Genetics

## 2023-04-17 DIAGNOSIS — R5383 Other fatigue: Secondary | ICD-10-CM | POA: Diagnosis not present

## 2023-04-17 DIAGNOSIS — R519 Headache, unspecified: Secondary | ICD-10-CM | POA: Diagnosis not present

## 2023-04-17 DIAGNOSIS — R790 Abnormal level of blood mineral: Secondary | ICD-10-CM | POA: Diagnosis not present

## 2023-04-17 DIAGNOSIS — F419 Anxiety disorder, unspecified: Secondary | ICD-10-CM | POA: Diagnosis not present

## 2023-04-17 DIAGNOSIS — Z13220 Encounter for screening for lipoid disorders: Secondary | ICD-10-CM | POA: Diagnosis not present

## 2023-04-17 DIAGNOSIS — F410 Panic disorder [episodic paroxysmal anxiety] without agoraphobia: Secondary | ICD-10-CM | POA: Diagnosis not present

## 2023-04-24 DIAGNOSIS — F422 Mixed obsessional thoughts and acts: Secondary | ICD-10-CM | POA: Diagnosis not present

## 2023-04-27 LAB — GENECONNECT MOLECULAR SCREEN: Genetic Analysis Overall Interpretation: NEGATIVE

## 2023-04-29 ENCOUNTER — Encounter: Payer: BC Managed Care – PPO | Admitting: Gastroenterology

## 2023-05-15 DIAGNOSIS — F422 Mixed obsessional thoughts and acts: Secondary | ICD-10-CM | POA: Diagnosis not present

## 2023-05-18 DIAGNOSIS — N943 Premenstrual tension syndrome: Secondary | ICD-10-CM | POA: Diagnosis not present

## 2023-05-18 DIAGNOSIS — F410 Panic disorder [episodic paroxysmal anxiety] without agoraphobia: Secondary | ICD-10-CM | POA: Diagnosis not present

## 2023-05-18 DIAGNOSIS — R5383 Other fatigue: Secondary | ICD-10-CM | POA: Diagnosis not present

## 2023-05-18 DIAGNOSIS — R519 Headache, unspecified: Secondary | ICD-10-CM | POA: Diagnosis not present

## 2023-05-20 DIAGNOSIS — H0014 Chalazion left upper eyelid: Secondary | ICD-10-CM | POA: Diagnosis not present

## 2023-06-05 DIAGNOSIS — F422 Mixed obsessional thoughts and acts: Secondary | ICD-10-CM | POA: Diagnosis not present

## 2023-06-26 DIAGNOSIS — F422 Mixed obsessional thoughts and acts: Secondary | ICD-10-CM | POA: Diagnosis not present

## 2023-07-24 DIAGNOSIS — F411 Generalized anxiety disorder: Secondary | ICD-10-CM | POA: Diagnosis not present

## 2023-08-11 DIAGNOSIS — R110 Nausea: Secondary | ICD-10-CM | POA: Diagnosis not present

## 2023-08-11 DIAGNOSIS — R1013 Epigastric pain: Secondary | ICD-10-CM | POA: Diagnosis not present

## 2023-08-11 DIAGNOSIS — K219 Gastro-esophageal reflux disease without esophagitis: Secondary | ICD-10-CM | POA: Diagnosis not present

## 2023-08-11 DIAGNOSIS — R198 Other specified symptoms and signs involving the digestive system and abdomen: Secondary | ICD-10-CM | POA: Diagnosis not present

## 2023-08-14 DIAGNOSIS — F411 Generalized anxiety disorder: Secondary | ICD-10-CM | POA: Diagnosis not present

## 2023-08-28 DIAGNOSIS — K2970 Gastritis, unspecified, without bleeding: Secondary | ICD-10-CM | POA: Diagnosis not present

## 2023-08-28 DIAGNOSIS — K209 Esophagitis, unspecified without bleeding: Secondary | ICD-10-CM | POA: Diagnosis not present

## 2023-08-28 DIAGNOSIS — K2960 Other gastritis without bleeding: Secondary | ICD-10-CM | POA: Diagnosis not present

## 2023-08-28 DIAGNOSIS — R1013 Epigastric pain: Secondary | ICD-10-CM | POA: Diagnosis not present

## 2023-08-28 DIAGNOSIS — K219 Gastro-esophageal reflux disease without esophagitis: Secondary | ICD-10-CM | POA: Diagnosis not present

## 2023-08-28 DIAGNOSIS — R110 Nausea: Secondary | ICD-10-CM | POA: Diagnosis not present

## 2023-09-11 DIAGNOSIS — F411 Generalized anxiety disorder: Secondary | ICD-10-CM | POA: Diagnosis not present

## 2023-09-25 DIAGNOSIS — R110 Nausea: Secondary | ICD-10-CM | POA: Diagnosis not present

## 2023-09-25 DIAGNOSIS — K219 Gastro-esophageal reflux disease without esophagitis: Secondary | ICD-10-CM | POA: Diagnosis not present

## 2023-09-25 DIAGNOSIS — R1013 Epigastric pain: Secondary | ICD-10-CM | POA: Diagnosis not present

## 2023-09-25 DIAGNOSIS — R198 Other specified symptoms and signs involving the digestive system and abdomen: Secondary | ICD-10-CM | POA: Diagnosis not present

## 2023-10-10 DIAGNOSIS — F411 Generalized anxiety disorder: Secondary | ICD-10-CM | POA: Diagnosis not present

## 2023-10-30 DIAGNOSIS — Z0000 Encounter for general adult medical examination without abnormal findings: Secondary | ICD-10-CM | POA: Diagnosis not present

## 2023-10-30 DIAGNOSIS — F419 Anxiety disorder, unspecified: Secondary | ICD-10-CM | POA: Diagnosis not present

## 2023-10-30 DIAGNOSIS — E559 Vitamin D deficiency, unspecified: Secondary | ICD-10-CM | POA: Diagnosis not present

## 2023-10-30 DIAGNOSIS — R7309 Other abnormal glucose: Secondary | ICD-10-CM | POA: Diagnosis not present

## 2023-10-30 DIAGNOSIS — Z113 Encounter for screening for infections with a predominantly sexual mode of transmission: Secondary | ICD-10-CM | POA: Diagnosis not present

## 2023-10-31 DIAGNOSIS — F422 Mixed obsessional thoughts and acts: Secondary | ICD-10-CM | POA: Diagnosis not present

## 2023-11-21 DIAGNOSIS — F411 Generalized anxiety disorder: Secondary | ICD-10-CM | POA: Diagnosis not present

## 2023-12-04 DIAGNOSIS — R059 Cough, unspecified: Secondary | ICD-10-CM | POA: Diagnosis not present

## 2023-12-04 DIAGNOSIS — R142 Eructation: Secondary | ICD-10-CM | POA: Diagnosis not present

## 2023-12-04 DIAGNOSIS — R110 Nausea: Secondary | ICD-10-CM | POA: Diagnosis not present

## 2023-12-04 DIAGNOSIS — R198 Other specified symptoms and signs involving the digestive system and abdomen: Secondary | ICD-10-CM | POA: Diagnosis not present

## 2023-12-13 DIAGNOSIS — S161XXA Strain of muscle, fascia and tendon at neck level, initial encounter: Secondary | ICD-10-CM | POA: Diagnosis not present

## 2023-12-13 DIAGNOSIS — G44209 Tension-type headache, unspecified, not intractable: Secondary | ICD-10-CM | POA: Diagnosis not present

## 2023-12-19 DIAGNOSIS — F411 Generalized anxiety disorder: Secondary | ICD-10-CM | POA: Diagnosis not present

## 2023-12-23 DIAGNOSIS — M542 Cervicalgia: Secondary | ICD-10-CM | POA: Diagnosis not present

## 2024-01-22 DIAGNOSIS — F411 Generalized anxiety disorder: Secondary | ICD-10-CM | POA: Diagnosis not present

## 2024-03-04 DIAGNOSIS — F422 Mixed obsessional thoughts and acts: Secondary | ICD-10-CM | POA: Diagnosis not present

## 2024-03-25 DIAGNOSIS — F422 Mixed obsessional thoughts and acts: Secondary | ICD-10-CM | POA: Diagnosis not present

## 2024-03-30 ENCOUNTER — Encounter: Payer: Self-pay | Admitting: Physician Assistant

## 2024-03-30 ENCOUNTER — Ambulatory Visit: Admitting: Physician Assistant

## 2024-03-30 DIAGNOSIS — F422 Mixed obsessional thoughts and acts: Secondary | ICD-10-CM | POA: Diagnosis not present

## 2024-03-30 DIAGNOSIS — F411 Generalized anxiety disorder: Secondary | ICD-10-CM

## 2024-03-30 MED ORDER — SERTRALINE HCL 25 MG PO TABS: ORAL_TABLET | ORAL | 1 refills | Status: DC

## 2024-03-30 NOTE — Progress Notes (Signed)
 "     [facility]  Patient ID: Abigail Rodgers,  MRN: [id number]  PCP: Chrystal Lamarr RAMAN, MD  Date of Evaluation: 03/30/2024 Time spent:20 minutes  Chief Complaint:  Chief Complaint   Anxiety    HISTORY/CURRENT STATUS: HPI  Anxiety  She has had more obsessions for months.  We had decided at her last visit 10/24/2022 for her to return as needed.  She feels like the OCD is out of control again and that she needs to restart medication.  Not having many compulsions but more so ruminating thoughts that take time out of her day.  No panic attacks.  She has Ativan  and takes it as needed.  It is not often because she prefers not to take medication at all.   She is not having symptoms of depression.  She is able to enjoy things.  Energy and motivation are good.  Work is going well.   No extreme sadness, tearfulness, or feelings of hopelessness.  Sleeps okay.  She sometimes has trouble falling asleep because of ruminating thoughts.  ADLs and personal hygiene are normal.   Denies any changes in concentration, making decisions, or remembering things.  Appetite has not changed.  No mania, delirium, AH/VH.  No SI/HI.  Individual Medical History/ Review of Systems: Changes? :No     Past medications for mental health diagnoses include: Zoloft , Ativan   Allergies: Penicillins and Lactose intolerance (gi)  Current Medications:  Current Outpatient Medications:    sertraline  (ZOLOFT ) 25 MG tablet, 1/2 po every day for 1- 2 weeks, then increase  1 po every day., Disp: 30 tablet, Rfl: 1   Lactobacillus (ACIDOPHILUS) 0.5 MG TABS, 1 tablet daily. (Patient not taking: Reported on 03/30/2024), Disp: , Rfl:    LORazepam  (ATIVAN ) 0.5 MG tablet, TAKE 1/2 TO 1 TABLET (0.25-0.5 MG TOTAL) BY MOUTH EVERY 8 HOURS AS NEEDED FOR ANXIETY (Patient not taking: Reported on 03/30/2024), Disp: 30 tablet, Rfl: 0   metoCLOPramide  (REGLAN ) 5 MG tablet, Please take 1 tablet by mouth the day before your procedure ( at  5:00pm) and then take 1 tablet by mouth a hour before prep the morning of the procedure. (Patient not taking: Reported on 03/30/2024), Disp: 2 tablet, Rfl: 0 Medication Side Effects: none  Family Medical/ Social History: Changes? No  MENTAL HEALTH EXAM:  There were no vitals taken for this visit.There is no height or weight on file to calculate BMI.  General Appearance: Casual, Neat and Well Groomed  Eye Contact:  Good  Speech:  Clear and Coherent and Normal Rate  Volume:  Normal  Mood:  Anxious  Affect:  Congruent  Thought Process:  Goal Directed and Descriptions of Associations: Circumstantial  Orientation:  Full (Time, Place, and Person)  Thought Content: Logical   Suicidal Thoughts:  No  Homicidal Thoughts:  No  Memory:  WNL  Judgement:  Good  Insight:  Good  Psychomotor Activity:  Normal  Concentration:  Concentration: Good  Recall:  Good  Fund of Knowledge: Good  Language: Good  Assets:  Communication Skills Desire for Improvement Financial Resources/Insurance Housing Physical Health Resilience Transportation Vocational/Educational  ADL's:  Intact  Cognition: WNL  Prognosis:  Good   DIAGNOSES:    ICD-10-CM   1. Mixed obsessional thoughts and acts  F42.2     2. Generalized anxiety disorder  F41.1       Receiving Psychotherapy: No  [facility] Orange at [facility] in the past.  RECOMMENDATIONS:  PDMP was reviewed.  Last Ativan   filled 07/10/2022. I provided approximately  20 minutes of face to face time during this encounter, including time spent before and after the visit in records review, medical decision making, counseling pertinent to today's visit, and charting.   We discussed the OCD and different options for treatment.  She took a low-dose of Zoloft  in the past which was beneficial.  We will restart that.  Continue Ativan  0.5 mg, 1/2-1 p.o. 3 times daily as needed anxiety.   Start Zoloft  25 mg, one half p.o. daily for 1 to 2 weeks and then increase  to 1 p.o. daily.  If she prefers, she can stay at 1/2 pill until her next visit.  Such a low dose does not commonly benefit OCD however.  She understands. Return in 6 weeks.  Verneita Cooks, PA-C  "

## 2024-04-05 DIAGNOSIS — F422 Mixed obsessional thoughts and acts: Secondary | ICD-10-CM | POA: Diagnosis not present

## 2024-04-21 ENCOUNTER — Other Ambulatory Visit: Payer: Self-pay | Admitting: Physician Assistant

## 2024-05-13 ENCOUNTER — Telehealth: Admitting: Physician Assistant

## 2024-05-13 ENCOUNTER — Encounter: Payer: Self-pay | Admitting: Physician Assistant

## 2024-05-13 DIAGNOSIS — F411 Generalized anxiety disorder: Secondary | ICD-10-CM

## 2024-05-13 DIAGNOSIS — F422 Mixed obsessional thoughts and acts: Secondary | ICD-10-CM

## 2024-05-13 MED ORDER — SERTRALINE HCL 25 MG PO TABS: 25.0000 mg | ORAL_TABLET | Freq: Every day | ORAL | 1 refills | Status: AC
